# Patient Record
Sex: Female | Born: 1940 | Race: Black or African American | Hispanic: No | Marital: Single | Smoking: Never smoker
Health system: Southern US, Community
[De-identification: ages and names within clinical notes are randomized; demographics above are authoritative.]

## PROBLEM LIST (undated history)

## (undated) DIAGNOSIS — I10 Essential (primary) hypertension: Secondary | ICD-10-CM

## (undated) DIAGNOSIS — E785 Hyperlipidemia, unspecified: Secondary | ICD-10-CM

## (undated) DIAGNOSIS — F0390 Unspecified dementia without behavioral disturbance: Secondary | ICD-10-CM

## (undated) DIAGNOSIS — E079 Disorder of thyroid, unspecified: Secondary | ICD-10-CM

## (undated) DIAGNOSIS — N184 Chronic kidney disease, stage 4 (severe): Secondary | ICD-10-CM

## (undated) HISTORY — PX: PITUITARY SURGERY: SHX203

## (undated) HISTORY — PX: BREAST SURGERY: SHX581

---

## 2020-06-07 ENCOUNTER — Other Ambulatory Visit: Payer: Self-pay

## 2020-06-07 ENCOUNTER — Emergency Department (HOSPITAL_COMMUNITY): Payer: Medicare PPO

## 2020-06-07 ENCOUNTER — Emergency Department (HOSPITAL_COMMUNITY)
Admission: EM | Admit: 2020-06-07 | Discharge: 2020-06-07 | Disposition: A | Discharge status: Home / Self Care | Payer: Medicare PPO | Attending: Emergency Medicine | Admitting: Emergency Medicine

## 2020-06-07 DIAGNOSIS — G9389 Other specified disorders of brain: Secondary | ICD-10-CM

## 2020-06-07 DIAGNOSIS — R531 Weakness: Secondary | ICD-10-CM | POA: Diagnosis not present

## 2020-06-07 DIAGNOSIS — U099 Post covid-19 condition, unspecified: Secondary | ICD-10-CM | POA: Diagnosis not present

## 2020-06-07 DIAGNOSIS — U071 COVID-19: Secondary | ICD-10-CM

## 2020-06-07 LAB — URINALYSIS, ROUTINE W REFLEX MICROSCOPIC
Bacteria, UA: NONE SEEN
Bilirubin Urine: NEGATIVE
Glucose, UA: NEGATIVE mg/dL
Hgb urine dipstick: NEGATIVE
Ketones, ur: NEGATIVE mg/dL
Leukocytes,Ua: NEGATIVE
Nitrite: NEGATIVE
Protein, ur: 100 mg/dL — AB
Specific Gravity, Urine: 1.012 (ref 1.005–1.030)
pH: 6 (ref 5.0–8.0)

## 2020-06-07 LAB — CBC WITH DIFFERENTIAL/PLATELET
Abs Immature Granulocytes: 0.03 10*3/uL (ref 0.00–0.07)
Basophils Absolute: 0 10*3/uL (ref 0.0–0.1)
Basophils Relative: 0 %
Eosinophils Absolute: 0 10*3/uL (ref 0.0–0.5)
Eosinophils Relative: 0 %
HCT: 35.4 % — ABNORMAL LOW (ref 36.0–46.0)
Hemoglobin: 10.9 g/dL — ABNORMAL LOW (ref 12.0–15.0)
Immature Granulocytes: 0 %
Lymphocytes Relative: 8 %
Lymphs Abs: 0.6 10*3/uL — ABNORMAL LOW (ref 0.7–4.0)
MCH: 26.2 pg (ref 26.0–34.0)
MCHC: 30.8 g/dL (ref 30.0–36.0)
MCV: 85.1 fL (ref 80.0–100.0)
Monocytes Absolute: 0.4 10*3/uL (ref 0.1–1.0)
Monocytes Relative: 6 %
Neutro Abs: 6.1 10*3/uL (ref 1.7–7.7)
Neutrophils Relative %: 86 %
Platelets: 199 10*3/uL (ref 150–400)
RBC: 4.16 MIL/uL (ref 3.87–5.11)
RDW: 17.2 % — ABNORMAL HIGH (ref 11.5–15.5)
WBC: 7.2 10*3/uL (ref 4.0–10.5)
nRBC: 0 % (ref 0.0–0.2)

## 2020-06-07 LAB — TROPONIN I (HIGH SENSITIVITY)
Troponin I (High Sensitivity): 36 ng/L — ABNORMAL HIGH (ref <18)
Troponin I (High Sensitivity): 37 ng/L — ABNORMAL HIGH (ref <18)

## 2020-06-07 LAB — COMPREHENSIVE METABOLIC PANEL
ALT: 24 U/L (ref 0–44)
AST: 35 U/L (ref 15–41)
Albumin: 3 g/dL — ABNORMAL LOW (ref 3.5–5.0)
Alkaline Phosphatase: 50 U/L (ref 38–126)
Anion gap: 9 (ref 5–15)
BUN: 41 mg/dL — ABNORMAL HIGH (ref 8–23)
CO2: 24 mmol/L (ref 22–32)
Calcium: 10.5 mg/dL — ABNORMAL HIGH (ref 8.9–10.3)
Chloride: 107 mmol/L (ref 98–111)
Creatinine, Ser: 3.15 mg/dL — ABNORMAL HIGH (ref 0.44–1.00)
GFR, Estimated: 14 mL/min — ABNORMAL LOW (ref >60)
Glucose, Bld: 144 mg/dL — ABNORMAL HIGH (ref 70–99)
Potassium: 3.6 mmol/L (ref 3.5–5.1)
Sodium: 140 mmol/L (ref 135–145)
Total Bilirubin: 0.2 mg/dL — ABNORMAL LOW (ref 0.3–1.2)
Total Protein: 6.7 g/dL (ref 6.5–8.1)

## 2020-06-07 MED ORDER — SODIUM CHLORIDE 0.9 % IV BOLUS
500.0000 mL | Freq: Once | INTRAVENOUS | Status: AC
  Administered 2020-06-07: 500 mL via INTRAVENOUS

## 2020-06-07 MED ORDER — DEXAMETHASONE SODIUM PHOSPHATE 10 MG/ML IJ SOLN
10.0000 mg | Freq: Once | INTRAMUSCULAR | Status: AC
  Administered 2020-06-07: 10 mg via INTRAVENOUS
  Filled 2020-06-07: qty 1

## 2020-06-07 NOTE — Discharge Instructions (Addendum)
Follow-up with Dr. Kathyrn Sheriff for your pituitary problem.  Call them this week for an appointment in the next week or 2.  He also has been referred to the [facility] clinic and they will get in touch with you if treatment is necessary

## 2020-06-07 NOTE — ED Provider Notes (Signed)
[facility] DEPT Provider Note   CSN: [id number] Arrival date & time: 06/07/20  1427     History No chief complaint on file.   Allison Wang is a 80 y.o. female.  Patient with positive COVID on 17 January and has been weak ever since then.   No vomiting no diarrhea no dyspnea  The history is provided by a relative. No language interpreter was used.  Weakness Severity:  Moderate Onset quality:  Sudden Timing:  Constant Progression:  Unable to specify Chronicity:  New Context: not alcohol use   Relieved by:  Nothing Worsened by:  Nothing Ineffective treatments:  None tried Associated symptoms: no abdominal pain        No past medical history on file.  There are no problems to display for this patient.    OB History   No obstetric history on file.     No family history on file.     Home Medications Prior to Admission medications   Not on File    Allergies    Patient has no allergy information on record.  Review of Systems   Review of Systems  Unable to perform ROS: Dementia  Gastrointestinal: Negative for abdominal pain.  Neurological: Positive for weakness.    Physical Exam Updated Vital Signs BP 136/81   Pulse 65   Temp 98.2 F (36.8 C) (Oral)   Resp 18   SpO2 95%   Physical Exam Vitals and nursing note reviewed.  Constitutional:      Appearance: She is well-developed.  HENT:     Head: Normocephalic.     Nose: Nose normal.  Eyes:     General: No scleral icterus.    Extraocular Movements: EOM normal.     Conjunctiva/sclera: Conjunctivae normal.  Neck:     Thyroid: No thyromegaly.  Cardiovascular:     Rate and Rhythm: Normal rate and regular rhythm.     Heart sounds: No murmur heard. No friction rub. No gallop.   Pulmonary:     Breath sounds: No stridor. No wheezing or rales.  Chest:     Chest wall: No tenderness.  Abdominal:     General: There is no distension.     Tenderness: There is no  abdominal tenderness. There is no rebound.  Musculoskeletal:        General: No edema. Normal range of motion.     Cervical back: Neck supple.  Lymphadenopathy:     Cervical: No cervical adenopathy.  Skin:    Findings: No erythema or rash.  Neurological:     Mental Status: She is alert.     Motor: No abnormal muscle tone.     Coordination: Coordination normal.     Comments: Patient is awake but nonverbal this is her normal dementia  Psychiatric:        Mood and Affect: Mood and affect normal.     ED Results / Procedures / Treatments   Labs (all labs ordered are listed, but only abnormal results are displayed) Labs Reviewed  CBC WITH DIFFERENTIAL/PLATELET - Abnormal; Notable for the following components:      Result Value   Hemoglobin 10.9 (*)    HCT 35.4 (*)    RDW 17.2 (*)    Lymphs Abs 0.6 (*)    All other components within normal limits  COMPREHENSIVE METABOLIC PANEL - Abnormal; Notable for the following components:   Glucose, Bld 144 (*)    BUN 41 (*)    Creatinine,  Ser 3.15 (*)    Calcium 10.5 (*)    Albumin 3.0 (*)    Total Bilirubin 0.2 (*)    GFR, Estimated 14 (*)    All other components within normal limits  TROPONIN I (HIGH SENSITIVITY) - Abnormal; Notable for the following components:   Troponin I (High Sensitivity) 36 (*)    All other components within normal limits  TROPONIN I (HIGH SENSITIVITY) - Abnormal; Notable for the following components:   Troponin I (High Sensitivity) 37 (*)    All other components within normal limits  URINALYSIS, ROUTINE W REFLEX MICROSCOPIC    EKG EKG Interpretation  Date/Time:  Sunday June 07 2020 14:56:58 EST Ventricular Rate:  68 PR Interval:    QRS Duration: 150 QT Interval:  443 QTC Calculation: 472 R Axis:   -131 Text Interpretation: Sinus rhythm Atrial premature complex Nonspecific intraventricular conduction delay Confirmed by Milton Ferguson [phone]) on 06/07/2020 6:36:25 PM   Radiology CT Head Wo  Contrast  Result Date: 06/07/2020 CLINICAL DATA:  COVID-19 positive, lethargy EXAM: CT HEAD WITHOUT CONTRAST TECHNIQUE: Contiguous axial images were obtained from the base of the skull through the vertex without intravenous contrast. COMPARISON:  None. FINDINGS: Brain: No acute infarct or hemorrhage. Lateral ventricles are unremarkable. No acute extra-axial fluid collections. There is a heterogeneous soft tissue mass within the sella turcica, measuring 3.3 x 2.6 x 1.8 cm. 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 MRI is recommended for further evaluation. Vascular: Prominent atherosclerosis of the internal carotid arteries. Skull: Bony remodeling of the sella turcica as a result of the mass. No other calvarial abnormalities. Sinuses/Orbits: Bony remodeling of the sphenoid sinus related to the sellar mass described above. Remaining paranasal sinuses are clear. Other: None. IMPRESSION: 1. Heterogeneous soft tissue mass centered within the sella turcica, measuring 3.3 x 2.6 x 1.8 cm. MRI is recommended for further evaluation. 2. No acute infarct or hemorrhage. Electronically Signed   By: Randa Ngo M.D.   On: 06/07/2020 15:40   DG Chest Port 1 View  Result Date: 06/07/2020 CLINICAL DATA:  COVID 19 positive 4 days ago, cough, dementia EXAM: PORTABLE CHEST 1 VIEW COMPARISON:  None. FINDINGS: The heart size and mediastinal contours are within normal limits. Both lungs are clear. The visualized skeletal structures are unremarkable. IMPRESSION: No active disease. Electronically Signed   By: Randa Ngo M.D.   On: 06/07/2020 16:06    Procedures Procedures (including critical care time)  Medications Ordered in ED Medications  sodium chloride 0.9 % bolus 500 mL (0 mLs Intravenous Stopped 06/07/20 1959)  dexamethasone (DECADRON) injection 10 mg (10 mg Intravenous Given 06/07/20 1716)    ED Course  I have reviewed  the triage vital signs and the nursing notes.  Pertinent labs & imaging results that were available during my care of the patient were reviewed by me and considered in my medical decision making (see chart for details).   Patient with COVID positive and elevated troponin with atrial fibs.  I spoke with cardiology and they feel like this is her normal.  She also has an elevated creatinine but her family says she has history of renal disease.  CT scan shows possible tumor.  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

## 2020-06-07 NOTE — ED Triage Notes (Signed)
Patient BIBA from home, tested COVID + on Wednesday. AOx1 at baseline and history of dementia. Daughter reports patient has been lethargic over the past few days. Also states she has been weak and unable to walk today.   BP 164/69 P 66 RR 40 CBG 171  T 99.8

## 2020-06-19 ENCOUNTER — Other Ambulatory Visit: Payer: Self-pay

## 2020-06-19 ENCOUNTER — Encounter (HOSPITAL_COMMUNITY): Payer: Self-pay | Admitting: Emergency Medicine

## 2020-06-19 ENCOUNTER — Ambulatory Visit (HOSPITAL_COMMUNITY)
Admission: EM | Admit: 2020-06-19 | Discharge: 2020-06-19 | Disposition: A | Discharge status: Home / Self Care | Payer: Medicare PPO

## 2020-06-19 DIAGNOSIS — I10 Essential (primary) hypertension: Secondary | ICD-10-CM | POA: Diagnosis not present

## 2020-06-19 HISTORY — DX: Disorder of thyroid, unspecified: E07.9

## 2020-06-19 HISTORY — DX: Hyperlipidemia, unspecified: E78.5

## 2020-06-19 HISTORY — DX: Essential (primary) hypertension: I10

## 2020-06-19 HISTORY — DX: Unspecified dementia, unspecified severity, without behavioral disturbance, psychotic disturbance, mood disturbance, and anxiety: F03.90

## 2020-06-19 NOTE — Discharge Instructions (Signed)
Please follow up with your primary care provider for recheck of your blood pressure, particularly if it continues to remain elevated.  Continue to monitor your blood pressure as needed.  Return for any chest pain, headache, vision changes or dizziness.

## 2020-06-19 NOTE — ED Provider Notes (Signed)
[facility]    CSN: [id number] Arrival date & time: 06/19/20  1835      History   Chief Complaint Chief Complaint  Patient presents with  . Hypertension    HPI Allison Wang is a 80 y.o. female.   Allison Wang presents with concerns about her blood pressure. She got a hold of a "5 hour energy" drink this morning of her daughters, and drank it. While at elder care her blood pressure was checked and found to be 193/60, 192/76 and 210/84, following drinking it. She takes 5mg  of amlodipine daily, per her daughter, and did take it today. Denies any headache, shortness of breath , vision changes, new weakness, swelling or other complaints. She had been generally more weak related to recent covid-19 infection, but daughter states today she did have more energy.    ROS per HPI, negative if not otherwise mentioned.      Past Medical History:  Diagnosis Date  . Dementia ([place])   . Hyperlipemia   . Hypertension   . Thyroid disease     There are no problems to display for this patient.   Past Surgical History:  Procedure Laterality Date  . BREAST SURGERY    . PITUITARY SURGERY      OB History   No obstetric history on file.      Home Medications    Prior to Admission medications   Medication Sig Start Date End Date Taking? Authorizing Provider  allopurinol (ZYLOPRIM) 100 MG tablet Take 200 mg by mouth daily. 06/18/20  Yes [provider]  amLODipine (NORVASC) 5 MG tablet  06/16/20  Yes [provider]  atorvastatin (LIPITOR) 40 MG tablet  06/18/20  Yes [provider]  brimonidine (ALPHAGAN) 0.2 % ophthalmic solution SMARTSIG:In Eye(s) 06/04/20  Yes [provider]  donepezil (ARICEPT) 10 MG tablet Take 10 mg by mouth daily. 06/18/20  Yes [provider]  folic acid (FOLVITE) 1 MG tablet  06/18/20  Yes [provider]  furosemide (LASIX) 20 MG tablet Take by mouth. 06/18/20  Yes [provider]   hydrALAZINE (APRESOLINE) 50 MG tablet Take 50 mg by mouth 3 (three) times daily. 06/04/20  Yes [provider]  hydrocortisone (CORTEF) 5 MG tablet Take by mouth. 06/18/20  Yes [provider]  levothyroxine (SYNTHROID) 50 MCG tablet Take 50 mcg by mouth daily. 06/18/20  Yes [provider]  potassium chloride SA (KLOR-CON) 20 MEQ tablet Take 20 mEq by mouth daily. 06/04/20  Yes [provider]  repaglinide (PRANDIN) 0.5 MG tablet Take 0.5 mg by mouth 3 (three) times daily. 06/04/20  Yes [provider]    Family History History reviewed. No pertinent family history.  Social History Social History   Tobacco Use  . Smoking status: Never Smoker  . Smokeless tobacco: Never Used  Substance Use Topics  . Alcohol use: Not Currently     Allergies   Patient has no known allergies.   Review of Systems Review of Systems   Physical Exam Triage Vital Signs ED Triage Vitals  Enc Vitals Group     BP 06/19/20 1907 (!) 147/68     Pulse Rate 06/19/20 1907 85     Resp 06/19/20 1907 17     Temp 06/19/20 1907 98.4 F (36.9 C)     Temp Source 06/19/20 1907 Oral     SpO2 06/19/20 1907 97 %     Weight --      Height --  Head Circumference --      Peak Flow --      Pain Score 06/19/20 1859 0     Pain Loc --      Pain Edu? --      Excl. in [place]? --    No data found.  Updated Vital Signs BP (!) 147/68 (BP Location: Right Arm)   Pulse 85   Temp 98.4 F (36.9 C) (Oral)   Resp 17   SpO2 97%   Visual Acuity Right Eye Distance:   Left Eye Distance:   Bilateral Distance:    Right Eye Near:   Left Eye Near:    Bilateral Near:     Physical Exam Constitutional:      General: She is not in acute distress.    Appearance: She is well-developed.  Cardiovascular:     Rate and Rhythm: Normal rate.  Pulmonary:     Effort: Pulmonary effort is normal.  Skin:    General: Skin is warm and dry.  Neurological:     Mental Status: She is alert.  Mental status is at baseline.      UC Treatments / Results  Labs (all labs ordered are listed, but only abnormal results are displayed) Labs Reviewed - No data to display  EKG   Radiology No results found.  Procedures Procedures (including critical care time)  Medications Ordered in UC Medications - No data to display  Initial Impression / Assessment and Plan / UC Course  I have reviewed the triage vital signs and the nursing notes.  Pertinent labs & imaging results that were available during my care of the patient were reviewed by me and considered in my medical decision making (see chart for details).     bp looks well tonight, much improved from previous blood pressures this morning, s/p drinking "5 hour energy."  Daughter verbalizes understanding that the caffeine likely contributed to her blood pressure elevation and to avoid these in the future. Return precautions provided. Daughter states they check her bp daily, to follow up with PCP for recheck as well. Patient's daughter verbalized understanding and agreeable to plan.   Final Clinical Impressions(s) / UC Diagnoses   Final diagnoses:  Hypertension, unspecified type     Discharge Instructions     Please follow up with your primary care provider for recheck of your blood pressure, particularly if it continues to remain elevated.  Continue to monitor your blood pressure as needed.  Return for any chest pain, headache, vision changes or dizziness.    ED Prescriptions    None     PDMP not reviewed this encounter.   Zigmund Gottron, NP 06/19/20 1929

## 2020-06-19 NOTE — ED Triage Notes (Signed)
Pt presents with HTN. Daughter states pt took 5 hour energy shot around 10am today. BP was taken at elder daycare with reading of  193/60 192/76 210/84

## 2020-11-05 ENCOUNTER — Other Ambulatory Visit (HOSPITAL_COMMUNITY): Payer: Self-pay | Admitting: Family Medicine

## 2020-11-05 DIAGNOSIS — R011 Cardiac murmur, unspecified: Secondary | ICD-10-CM

## 2020-11-06 ENCOUNTER — Other Ambulatory Visit: Payer: Self-pay | Admitting: Family Medicine

## 2020-11-06 DIAGNOSIS — Z780 Asymptomatic menopausal state: Secondary | ICD-10-CM

## 2020-11-12 ENCOUNTER — Other Ambulatory Visit (HOSPITAL_COMMUNITY): Payer: Self-pay | Admitting: Neurosurgery

## 2020-11-12 DIAGNOSIS — D497 Neoplasm of unspecified behavior of endocrine glands and other parts of nervous system: Secondary | ICD-10-CM

## 2020-11-24 ENCOUNTER — Other Ambulatory Visit: Payer: Self-pay

## 2020-11-24 ENCOUNTER — Ambulatory Visit (HOSPITAL_COMMUNITY)
Admission: RE | Admit: 2020-11-24 | Discharge: 2020-11-24 | Disposition: A | Discharge status: Home / Self Care | Payer: Self-pay | Source: Ambulatory Visit | Attending: Family Medicine | Admitting: Family Medicine

## 2020-11-24 DIAGNOSIS — R011 Cardiac murmur, unspecified: Secondary | ICD-10-CM | POA: Insufficient documentation

## 2020-11-24 DIAGNOSIS — I080 Rheumatic disorders of both mitral and aortic valves: Secondary | ICD-10-CM | POA: Insufficient documentation

## 2020-11-24 DIAGNOSIS — I10 Essential (primary) hypertension: Secondary | ICD-10-CM | POA: Insufficient documentation

## 2020-11-24 DIAGNOSIS — I313 Pericardial effusion (noninflammatory): Secondary | ICD-10-CM | POA: Insufficient documentation

## 2020-11-24 DIAGNOSIS — E785 Hyperlipidemia, unspecified: Secondary | ICD-10-CM | POA: Insufficient documentation

## 2020-11-24 LAB — ECHOCARDIOGRAM COMPLETE
AR max vel: 2.6 cm2
AV Area VTI: 2.8 cm2
AV Area mean vel: 2.64 cm2
AV Mean grad: 8.8 mmHg
AV Peak grad: 17 mmHg
Ao pk vel: 2.06 m/s
Area-P 1/2: 3.12 cm2
MV M vel: 4.97 m/s
MV Peak grad: 98.8 mmHg
S' Lateral: 2.5 cm

## 2020-11-26 ENCOUNTER — Encounter (HOSPITAL_COMMUNITY): Payer: Self-pay

## 2020-11-26 ENCOUNTER — Ambulatory Visit (HOSPITAL_COMMUNITY)
Admission: RE | Admit: 2020-11-26 | Discharge: 2020-11-26 | Disposition: A | Discharge status: Home / Self Care | Payer: Medicare PPO | Source: Ambulatory Visit | Attending: Neurosurgery | Admitting: Neurosurgery

## 2020-11-26 ENCOUNTER — Other Ambulatory Visit: Payer: Self-pay

## 2020-11-26 DIAGNOSIS — D497 Neoplasm of unspecified behavior of endocrine glands and other parts of nervous system: Secondary | ICD-10-CM

## 2020-11-26 NOTE — Progress Notes (Signed)
Pt could not tolerate MRI. Could not remain still during scanning which resulted in motion degradation of imaging and pt pulled out IV that had to be obrtained throught IV team consult. Family aware and will f/u with ordering provider on a possible solution.

## 2020-11-27 ENCOUNTER — Other Ambulatory Visit: Payer: Self-pay | Admitting: Internal Medicine

## 2020-11-27 DIAGNOSIS — N184 Chronic kidney disease, stage 4 (severe): Secondary | ICD-10-CM

## 2020-12-01 ENCOUNTER — Ambulatory Visit
Admission: RE | Admit: 2020-12-01 | Discharge: 2020-12-01 | Disposition: A | Discharge status: Home / Self Care | Payer: Medicare PPO | Source: Ambulatory Visit | Attending: Internal Medicine | Admitting: Internal Medicine

## 2020-12-01 DIAGNOSIS — N184 Chronic kidney disease, stage 4 (severe): Secondary | ICD-10-CM

## 2020-12-21 ENCOUNTER — Emergency Department (HOSPITAL_COMMUNITY): Payer: Medicare PPO

## 2020-12-21 ENCOUNTER — Other Ambulatory Visit: Payer: Self-pay

## 2020-12-21 ENCOUNTER — Inpatient Hospital Stay (HOSPITAL_COMMUNITY): Payer: Medicare PPO

## 2020-12-21 ENCOUNTER — Inpatient Hospital Stay (HOSPITAL_COMMUNITY)
Admission: EM | Admit: 2020-12-21 | Discharge: 2021-01-14 | DRG: 193 | Disposition: E | Payer: Medicare PPO | Attending: Family Medicine | Admitting: Family Medicine

## 2020-12-21 ENCOUNTER — Encounter (HOSPITAL_COMMUNITY): Payer: Self-pay

## 2020-12-21 DIAGNOSIS — J189 Pneumonia, unspecified organism: Secondary | ICD-10-CM | POA: Diagnosis present

## 2020-12-21 DIAGNOSIS — Z8616 Personal history of COVID-19: Secondary | ICD-10-CM | POA: Diagnosis not present

## 2020-12-21 DIAGNOSIS — G9341 Metabolic encephalopathy: Secondary | ICD-10-CM | POA: Diagnosis present

## 2020-12-21 DIAGNOSIS — R159 Full incontinence of feces: Secondary | ICD-10-CM | POA: Diagnosis present

## 2020-12-21 DIAGNOSIS — D497 Neoplasm of unspecified behavior of endocrine glands and other parts of nervous system: Secondary | ICD-10-CM | POA: Diagnosis present

## 2020-12-21 DIAGNOSIS — I132 Hypertensive heart and chronic kidney disease with heart failure and with stage 5 chronic kidney disease, or end stage renal disease: Secondary | ICD-10-CM | POA: Diagnosis present

## 2020-12-21 DIAGNOSIS — E039 Hypothyroidism, unspecified: Secondary | ICD-10-CM | POA: Diagnosis present

## 2020-12-21 DIAGNOSIS — E119 Type 2 diabetes mellitus without complications: Secondary | ICD-10-CM

## 2020-12-21 DIAGNOSIS — Z888 Allergy status to other drugs, medicaments and biological substances status: Secondary | ICD-10-CM

## 2020-12-21 DIAGNOSIS — R001 Bradycardia, unspecified: Secondary | ICD-10-CM | POA: Diagnosis not present

## 2020-12-21 DIAGNOSIS — R0602 Shortness of breath: Secondary | ICD-10-CM

## 2020-12-21 DIAGNOSIS — E1122 Type 2 diabetes mellitus with diabetic chronic kidney disease: Secondary | ICD-10-CM | POA: Diagnosis present

## 2020-12-21 DIAGNOSIS — E230 Hypopituitarism: Secondary | ICD-10-CM | POA: Diagnosis present

## 2020-12-21 DIAGNOSIS — Z7189 Other specified counseling: Secondary | ICD-10-CM | POA: Diagnosis not present

## 2020-12-21 DIAGNOSIS — N179 Acute kidney failure, unspecified: Secondary | ICD-10-CM | POA: Diagnosis present

## 2020-12-21 DIAGNOSIS — F0390 Unspecified dementia without behavioral disturbance: Secondary | ICD-10-CM | POA: Diagnosis present

## 2020-12-21 DIAGNOSIS — Z66 Do not resuscitate: Secondary | ICD-10-CM | POA: Diagnosis not present

## 2020-12-21 DIAGNOSIS — M109 Gout, unspecified: Secondary | ICD-10-CM | POA: Diagnosis present

## 2020-12-21 DIAGNOSIS — Z20822 Contact with and (suspected) exposure to covid-19: Secondary | ICD-10-CM | POA: Diagnosis present

## 2020-12-21 DIAGNOSIS — R627 Adult failure to thrive: Secondary | ICD-10-CM | POA: Diagnosis present

## 2020-12-21 DIAGNOSIS — R7989 Other specified abnormal findings of blood chemistry: Secondary | ICD-10-CM | POA: Diagnosis not present

## 2020-12-21 DIAGNOSIS — Z515 Encounter for palliative care: Secondary | ICD-10-CM

## 2020-12-21 DIAGNOSIS — R0902 Hypoxemia: Secondary | ICD-10-CM | POA: Diagnosis not present

## 2020-12-21 DIAGNOSIS — R7401 Elevation of levels of liver transaminase levels: Secondary | ICD-10-CM | POA: Diagnosis present

## 2020-12-21 DIAGNOSIS — Z79890 Hormone replacement therapy (postmenopausal): Secondary | ICD-10-CM

## 2020-12-21 DIAGNOSIS — E860 Dehydration: Secondary | ICD-10-CM | POA: Diagnosis present

## 2020-12-21 DIAGNOSIS — E785 Hyperlipidemia, unspecified: Secondary | ICD-10-CM | POA: Diagnosis present

## 2020-12-21 DIAGNOSIS — D649 Anemia, unspecified: Secondary | ICD-10-CM

## 2020-12-21 DIAGNOSIS — Z6826 Body mass index (BMI) 26.0-26.9, adult: Secondary | ICD-10-CM

## 2020-12-21 DIAGNOSIS — J9601 Acute respiratory failure with hypoxia: Secondary | ICD-10-CM | POA: Diagnosis present

## 2020-12-21 DIAGNOSIS — Z833 Family history of diabetes mellitus: Secondary | ICD-10-CM

## 2020-12-21 DIAGNOSIS — Z7984 Long term (current) use of oral hypoglycemic drugs: Secondary | ICD-10-CM

## 2020-12-21 DIAGNOSIS — E875 Hyperkalemia: Secondary | ICD-10-CM | POA: Diagnosis present

## 2020-12-21 DIAGNOSIS — D631 Anemia in chronic kidney disease: Secondary | ICD-10-CM | POA: Diagnosis present

## 2020-12-21 DIAGNOSIS — N189 Chronic kidney disease, unspecified: Secondary | ICD-10-CM | POA: Diagnosis present

## 2020-12-21 DIAGNOSIS — I5032 Chronic diastolic (congestive) heart failure: Secondary | ICD-10-CM | POA: Diagnosis present

## 2020-12-21 DIAGNOSIS — R4182 Altered mental status, unspecified: Secondary | ICD-10-CM | POA: Diagnosis not present

## 2020-12-21 DIAGNOSIS — Z7401 Bed confinement status: Secondary | ICD-10-CM

## 2020-12-21 DIAGNOSIS — R1310 Dysphagia, unspecified: Secondary | ICD-10-CM | POA: Diagnosis present

## 2020-12-21 DIAGNOSIS — R32 Unspecified urinary incontinence: Secondary | ICD-10-CM | POA: Diagnosis present

## 2020-12-21 DIAGNOSIS — I10 Essential (primary) hypertension: Secondary | ICD-10-CM

## 2020-12-21 DIAGNOSIS — N185 Chronic kidney disease, stage 5: Secondary | ICD-10-CM | POA: Diagnosis present

## 2020-12-21 DIAGNOSIS — Z8249 Family history of ischemic heart disease and other diseases of the circulatory system: Secondary | ICD-10-CM

## 2020-12-21 DIAGNOSIS — Z79899 Other long term (current) drug therapy: Secondary | ICD-10-CM

## 2020-12-21 DIAGNOSIS — N170 Acute kidney failure with tubular necrosis: Secondary | ICD-10-CM | POA: Diagnosis not present

## 2020-12-21 HISTORY — DX: Chronic kidney disease, stage 4 (severe): N18.4

## 2020-12-21 LAB — CBC WITH DIFFERENTIAL/PLATELET
Abs Immature Granulocytes: 0.04 10*3/uL (ref 0.00–0.07)
Basophils Absolute: 0 10*3/uL (ref 0.0–0.1)
Basophils Relative: 0 %
Eosinophils Absolute: 0 10*3/uL (ref 0.0–0.5)
Eosinophils Relative: 0 %
HCT: 25.6 % — ABNORMAL LOW (ref 36.0–46.0)
Hemoglobin: 7.4 g/dL — ABNORMAL LOW (ref 12.0–15.0)
Immature Granulocytes: 0 %
Lymphocytes Relative: 7 %
Lymphs Abs: 0.7 10*3/uL (ref 0.7–4.0)
MCH: 25.3 pg — ABNORMAL LOW (ref 26.0–34.0)
MCHC: 28.9 g/dL — ABNORMAL LOW (ref 30.0–36.0)
MCV: 87.4 fL (ref 80.0–100.0)
Monocytes Absolute: 0.3 10*3/uL (ref 0.1–1.0)
Monocytes Relative: 4 %
Neutro Abs: 8.2 10*3/uL — ABNORMAL HIGH (ref 1.7–7.7)
Neutrophils Relative %: 89 %
Platelets: 308 10*3/uL (ref 150–400)
RBC: 2.93 MIL/uL — ABNORMAL LOW (ref 3.87–5.11)
RDW: 19.9 % — ABNORMAL HIGH (ref 11.5–15.5)
WBC: 9.2 10*3/uL (ref 4.0–10.5)
nRBC: 0.9 % — ABNORMAL HIGH (ref 0.0–0.2)

## 2020-12-21 LAB — RAPID URINE DRUG SCREEN, HOSP PERFORMED
Amphetamines: NOT DETECTED
Barbiturates: NOT DETECTED
Benzodiazepines: NOT DETECTED
Cocaine: NOT DETECTED
Opiates: NOT DETECTED
Tetrahydrocannabinol: NOT DETECTED

## 2020-12-21 LAB — COMPREHENSIVE METABOLIC PANEL
ALT: 263 U/L — ABNORMAL HIGH (ref 0–44)
ALT: 234 U/L — ABNORMAL HIGH (ref 0–44)
AST: 471 U/L — ABNORMAL HIGH (ref 15–41)
AST: 327 U/L — ABNORMAL HIGH (ref 15–41)
Albumin: 3.1 g/dL — ABNORMAL LOW (ref 3.5–5.0)
Albumin: 2.9 g/dL — ABNORMAL LOW (ref 3.5–5.0)
Alkaline Phosphatase: 130 U/L — ABNORMAL HIGH (ref 38–126)
Alkaline Phosphatase: 115 U/L (ref 38–126)
Anion gap: 12 (ref 5–15)
Anion gap: 10 (ref 5–15)
BUN: 62 mg/dL — ABNORMAL HIGH (ref 8–23)
BUN: 63 mg/dL — ABNORMAL HIGH (ref 8–23)
CO2: 23 mmol/L (ref 22–32)
CO2: 24 mmol/L (ref 22–32)
Calcium: 9.9 mg/dL (ref 8.9–10.3)
Calcium: 9.8 mg/dL (ref 8.9–10.3)
Chloride: 106 mmol/L (ref 98–111)
Chloride: 108 mmol/L (ref 98–111)
Creatinine, Ser: 4.13 mg/dL — ABNORMAL HIGH (ref 0.44–1.00)
Creatinine, Ser: 4.16 mg/dL — ABNORMAL HIGH (ref 0.44–1.00)
GFR, Estimated: 10 mL/min — ABNORMAL LOW (ref >60)
GFR, Estimated: 10 mL/min — ABNORMAL LOW (ref >60)
Glucose, Bld: 180 mg/dL — ABNORMAL HIGH (ref 70–99)
Glucose, Bld: 131 mg/dL — ABNORMAL HIGH (ref 70–99)
Potassium: 6.1 mmol/L — ABNORMAL HIGH (ref 3.5–5.1)
Potassium: 4.1 mmol/L (ref 3.5–5.1)
Sodium: 141 mmol/L (ref 135–145)
Sodium: 142 mmol/L (ref 135–145)
Total Bilirubin: 1.1 mg/dL (ref 0.3–1.2)
Total Bilirubin: 0.4 mg/dL (ref 0.3–1.2)
Total Protein: 5.5 g/dL — ABNORMAL LOW (ref 6.5–8.1)
Total Protein: 6 g/dL — ABNORMAL LOW (ref 6.5–8.1)

## 2020-12-21 LAB — RESP PANEL BY RT-PCR (FLU A&B, COVID) ARPGX2
Influenza A by PCR: NEGATIVE
Influenza B by PCR: NEGATIVE
SARS Coronavirus 2 by RT PCR: NEGATIVE

## 2020-12-21 LAB — ABO/RH: ABO/RH(D): B POS

## 2020-12-21 LAB — SODIUM, URINE, RANDOM: Sodium, Ur: <10 mmol/L

## 2020-12-21 LAB — I-STAT ARTERIAL BLOOD GAS, ED
Acid-Base Excess: 1 mmol/L (ref 0.0–2.0)
Bicarbonate: 26.6 mmol/L (ref 20.0–28.0)
Calcium, Ion: 1.38 mmol/L (ref 1.15–1.40)
HCT: 26 % — ABNORMAL LOW (ref 36.0–46.0)
Hemoglobin: 8.8 g/dL — ABNORMAL LOW (ref 12.0–15.0)
O2 Saturation: 83 %
Patient temperature: 98.6
Potassium: 4.2 mmol/L (ref 3.5–5.1)
Sodium: 145 mmol/L (ref 135–145)
TCO2: 28 mmol/L (ref 22–32)
pCO2 arterial: 45.7 mmHg (ref 32.0–48.0)
pH, Arterial: 7.373 (ref 7.350–7.450)
pO2, Arterial: 50 mmHg — ABNORMAL LOW (ref 83.0–108.0)

## 2020-12-21 LAB — PHOSPHORUS: Phosphorus: 4.7 mg/dL — ABNORMAL HIGH (ref 2.5–4.6)

## 2020-12-21 LAB — URINALYSIS, ROUTINE W REFLEX MICROSCOPIC
Bacteria, UA: NONE SEEN
Bilirubin Urine: NEGATIVE
Glucose, UA: NEGATIVE mg/dL
Hgb urine dipstick: NEGATIVE
Ketones, ur: NEGATIVE mg/dL
Leukocytes,Ua: NEGATIVE
Nitrite: NEGATIVE
Protein, ur: 30 mg/dL — AB
Specific Gravity, Urine: 1.016 (ref 1.005–1.030)
pH: 5 (ref 5.0–8.0)

## 2020-12-21 LAB — PROTIME-INR
INR: 1 (ref 0.8–1.2)
Prothrombin Time: 13.3 seconds (ref 11.4–15.2)

## 2020-12-21 LAB — HEMOGLOBIN A1C
Hgb A1c MFr Bld: 5.8 % — ABNORMAL HIGH (ref 4.8–5.6)
Mean Plasma Glucose: 119.76 mg/dL

## 2020-12-21 LAB — POC OCCULT BLOOD, ED: Fecal Occult Bld: POSITIVE — AB

## 2020-12-21 LAB — AMMONIA: Ammonia: 22 umol/L (ref 9–35)

## 2020-12-21 LAB — BRAIN NATRIURETIC PEPTIDE: B Natriuretic Peptide: 913.4 pg/mL — ABNORMAL HIGH (ref 0.0–100.0)

## 2020-12-21 LAB — CBG MONITORING, ED
Glucose-Capillary: 126 mg/dL — ABNORMAL HIGH (ref 70–99)
Glucose-Capillary: 173 mg/dL — ABNORMAL HIGH (ref 70–99)
Glucose-Capillary: 99 mg/dL (ref 70–99)

## 2020-12-21 LAB — LACTIC ACID, PLASMA
Lactic Acid, Venous: 2 mmol/L (ref 0.5–1.9)
Lactic Acid, Venous: 2.5 mmol/L (ref 0.5–1.9)
Lactic Acid, Venous: 1.7 mmol/L (ref 0.5–1.9)

## 2020-12-21 LAB — PROCALCITONIN: Procalcitonin: 1.07 ng/mL

## 2020-12-21 LAB — STREP PNEUMONIAE URINARY ANTIGEN: Strep Pneumo Urinary Antigen: NEGATIVE

## 2020-12-21 LAB — MRSA NEXT GEN BY PCR, NASAL: MRSA by PCR Next Gen: NOT DETECTED

## 2020-12-21 LAB — CREATININE, URINE, RANDOM: Creatinine, Urine: 165.86 mg/dL

## 2020-12-21 LAB — TROPONIN I (HIGH SENSITIVITY): Troponin I (High Sensitivity): 98 ng/L — ABNORMAL HIGH (ref <18)

## 2020-12-21 LAB — CK: Total CK: 62 U/L (ref 38–234)

## 2020-12-21 LAB — MAGNESIUM: Magnesium: 2.4 mg/dL (ref 1.7–2.4)

## 2020-12-21 LAB — TSH: TSH: 1.411 u[IU]/mL (ref 0.350–4.500)

## 2020-12-21 MED ORDER — SODIUM CHLORIDE 0.9 % IV SOLN
2.0000 g | INTRAVENOUS | Status: DC
  Administered 2020-12-22 – 2020-12-23 (×2): 2 g via INTRAVENOUS
  Filled 2020-12-21 (×2): qty 20

## 2020-12-21 MED ORDER — SODIUM CHLORIDE 0.9 % IV SOLN
1.0000 g | Freq: Once | INTRAVENOUS | Status: AC
  Administered 2020-12-21: 1 g via INTRAVENOUS
  Filled 2020-12-21: qty 10

## 2020-12-21 MED ORDER — SODIUM CHLORIDE 0.9% IV SOLUTION: Freq: Once | INTRAVENOUS | Status: DC

## 2020-12-21 MED ORDER — POLYETHYLENE GLYCOL 3350 17 G PO PACK: 17.0000 g | PACK | Freq: Every day | ORAL | Status: DC | PRN

## 2020-12-21 MED ORDER — ALBUTEROL SULFATE (2.5 MG/3ML) 0.083% IN NEBU: 2.5000 mg | INHALATION_SOLUTION | RESPIRATORY_TRACT | Status: DC | PRN

## 2020-12-21 MED ORDER — SODIUM CHLORIDE 0.9 % IV SOLN
75.0000 mL/h | INTRAVENOUS | Status: AC
  Administered 2020-12-21: 75 mL/h via INTRAVENOUS

## 2020-12-21 MED ORDER — LATANOPROST 0.005 % OP SOLN
1.0000 [drp] | Freq: Every day | OPHTHALMIC | Status: DC
  Administered 2020-12-21 – 2020-12-24 (×3): 1 [drp] via OPHTHALMIC
  Filled 2020-12-21 (×2): qty 2.5

## 2020-12-21 MED ORDER — SODIUM CHLORIDE 0.9 % IV SOLN
500.0000 mg | INTRAVENOUS | Status: DC
  Administered 2020-12-21 – 2020-12-24 (×4): 500 mg via INTRAVENOUS
  Filled 2020-12-21 (×5): qty 500

## 2020-12-21 MED ORDER — LEVOTHYROXINE SODIUM 50 MCG PO TABS
50.0000 ug | ORAL_TABLET | Freq: Every day | ORAL | Status: DC
  Administered 2020-12-23: 50 ug via ORAL
  Filled 2020-12-21 (×2): qty 1

## 2020-12-21 MED ORDER — DEXTROSE 50 % IV SOLN
1.0000 | Freq: Once | INTRAVENOUS | Status: DC
  Filled 2020-12-21: qty 50

## 2020-12-21 MED ORDER — INSULIN ASPART 100 UNIT/ML IV SOLN: 5.0000 [IU] | Freq: Once | INTRAVENOUS | Status: DC

## 2020-12-21 MED ORDER — BRIMONIDINE TARTRATE 0.2 % OP SOLN
1.0000 [drp] | Freq: Every morning | OPHTHALMIC | Status: DC
  Administered 2020-12-24: 1 [drp] via OPHTHALMIC
  Filled 2020-12-21: qty 5

## 2020-12-21 MED ORDER — ACETAMINOPHEN 650 MG RE SUPP: 650.0000 mg | Freq: Four times a day (QID) | RECTAL | Status: DC | PRN

## 2020-12-21 MED ORDER — ACETAMINOPHEN 325 MG PO TABS: 650.0000 mg | ORAL_TABLET | Freq: Four times a day (QID) | ORAL | Status: DC | PRN

## 2020-12-21 MED ORDER — HYDROCORTISONE NA SUCCINATE PF 100 MG IJ SOLR
100.0000 mg | Freq: Three times a day (TID) | INTRAMUSCULAR | Status: DC
  Administered 2020-12-21 – 2020-12-25 (×11): 100 mg via INTRAVENOUS
  Filled 2020-12-21 (×11): qty 2

## 2020-12-21 MED ORDER — INSULIN ASPART 100 UNIT/ML IJ SOLN
0.0000 [IU] | INTRAMUSCULAR | Status: DC
  Administered 2020-12-22 – 2020-12-23 (×3): 1 [IU] via SUBCUTANEOUS
  Administered 2020-12-24: 3 [IU] via SUBCUTANEOUS
  Administered 2020-12-24: 2 [IU] via SUBCUTANEOUS
  Administered 2020-12-24: 5 [IU] via SUBCUTANEOUS
  Administered 2020-12-24 – 2020-12-25 (×2): 2 [IU] via SUBCUTANEOUS

## 2020-12-21 NOTE — ED Notes (Signed)
US at bedside

## 2020-12-21 NOTE — ED Notes (Signed)
Nurse attempting to place IV US/obtain blood.

## 2020-12-21 NOTE — ED Notes (Signed)
Female purewick placed.

## 2020-12-21 NOTE — ED Notes (Signed)
I was finally able to obtain blood from pt. I asked ED resident if he wants to me to send down another CMP to recheck labs to see if they were correct due to the CBC being hemolyzed with the first one. He said to recheck the values first before giving the insulin for the potassium.

## 2020-12-21 NOTE — ED Notes (Signed)
Dr. Roel Cluck aware I'm waiting for repeat CMP to come back before giving insulin especially since K on ABG was normal.

## 2020-12-21 NOTE — ED Triage Notes (Signed)
Pt from home. LKN at 0200. Daughter went over today and pt has been laying in bed all day. Usually pt has been able to walk with a walker and feeds herself, but hasn't been able to today. No unilateral weakness. EMS says pt hasn't spoke to them at all.

## 2020-12-21 NOTE — ED Notes (Signed)
I attempted to place IV twice without success. IV team consulted. See new order.

## 2020-12-21 NOTE — ED Notes (Signed)
I straight cath'd pt for urine since brief was still dry. Occult completed and taken to lab. Pt pulled up in bed and readjusted. New brief placed on pt (had a small amount of stool in it) and purewick placed again.

## 2020-12-21 NOTE — ED Notes (Signed)
Patient transported to CT 

## 2020-12-21 NOTE — ED Notes (Signed)
ED resident attempting to place IV [place].

## 2020-12-21 NOTE — Progress Notes (Signed)
Increased oxygen to 8lpm high flow cannula

## 2020-12-21 NOTE — ED Notes (Signed)
I attempted to obtain blood 4 times without success. I tried pulling from [place] lines, but they still don't pull back. Dr. Roel Cluck made aware.

## 2020-12-21 NOTE — ED Notes (Signed)
X-ray at bedside

## 2020-12-21 NOTE — ED Notes (Signed)
Pt is here slightly leaning to L side (new). Pt usually disoriented x4, able to walk with a walker short distances and able to feed herself. Pt unable to do it at all today. Pt nonverbal with me, daughter says she doesn't say a lot at home, but usually says more than that. Pt able to follow simple commands. Pt's pupils pinpoint. Daughter says that's normal for her and dr. Gilford Raid aware. Pt's HR dropping into the mid-30s. Dr. Gilford Raid aware. She says as long as pt's BP is maintaining, we'll just watch it.

## 2020-12-21 NOTE — Progress Notes (Signed)
ABG drawn while patient was on 3lpm

## 2020-12-21 NOTE — H&P (Signed)
Allison Wang:[id number] DOB: [birth year] DOA: 12/15/2020     PCP: Allison Nova, MD   Outpatient Specialists:    Nephrology:  Dr. Vianne Wang Neurology supposed to start follow up   Patient arrived to ER on 12/23/2020 at 1525 Referred by Attending Allison Pence, MD Patient coming from: home Lives  With family  Chief Complaint:   Chief Complaint  Patient presents with   Altered Mental Status    HPI: Allison Wang is a 80 y.o. female with medical history significant of DM2, dementia, CKD, thyroid disease, HTN, HLD covid infection Jan 2022, gout, chronic diastolic CHF  Pituitary disease  Presented with  confusion Family deneis any fever or cough At baseline ambulates has significant dementia and does not always recognize family Has known pituitary disease was supposed to have MIR for this but could not tolerate due to dementia plan was for MRI under anesthsia Does not smoke or drinlk  Recently moved here from [place]  Has  been vaccinated against COVID and boosted   Initial COVID TEST  NEGATIVE   Lab Results  Component Value Date   [place] NEGATIVE 12/31/2020     Regarding pertinent Chronic problems:     Hyperlipidemia - on statins Lipitor Lipid Panel  No results found for: CHOL, TRIG, HDL, CHOLHDL, VLDL, LDLCALC, LDLDIRECT, LABVLDL   HTN on NOrvasc Lasix, hydralazine  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  Lab Results  Component Value Date   CREATININE 4.13 (H) 12/20/2020   CREATININE 3.15 (H) 06/07/2020       Dementia - on Aricept   Chronic anemia  - baseline hg Hemoglobin & Hematocrit  Recent Labs    06/07/20 1713  HGB 10.9*   While in ER: CXR for CAP Started on Antibiotics Noted to have worsening renal function elevated K Elevated LFT    ED Triage Vitals  Enc Vitals Group     BP 01/10/2021 1553 (!) 129/96     Pulse Rate 12/20/2020 1553 (!) 47     Resp 01/06/2021 1553 16     Temp 01/01/2021 1553 97.6 F (36.4 C)     Temp Source 12/18/2020 1553 Oral     SpO2 01/01/2021 1543 95 %     Weight 12/29/2020 1555 152 lb (68.9 kg)     Height 12/27/2020 1555 5\' 3"  (1.6 m)     Head Circumference --      Peak Flow --      Pain Score --      Pain Loc --      Pain Edu? --      Excl. in [place]? --   TMAX(24)@     _________________________________________ Significant initial  Findings: Abnormal Labs Reviewed  COMPREHENSIVE METABOLIC PANEL - Abnormal; Notable for the following components:      Result Value   Potassium 6.1 (*)    Glucose, Bld 180 (*)    BUN 62 (*)    Creatinine, Ser 4.13 (*)    Total Protein 5.5 (*)    Albumin 3.1 (*)  AST 471 (*)    ALT 263 (*)    Alkaline Phosphatase 130 (*)    GFR, Estimated 10 (*)    All other components within normal limits  CBG MONITORING, ED - Abnormal; Notable for the following components:   Glucose-Capillary 173 (*)    All other components within normal limits  CBG MONITORING, ED - Abnormal; Notable for the following components:   Glucose-Capillary 126 (*)    All other components within normal limits   ____________________________________________ Ordered CT HEAD   NON acute  CXR - cardiomegaly, left side consolidation     RUQ [place] -  NON acute showing renal disease _________________________ Troponin  ordered ECG: Ordered Personally reviewed by me showing: HR :38 Rhythm: Sinus bradycardia     nonspecific changes,   QTC 423 ____________________ This patient meets SIRS Criteria and may be septic.    WBC     Component Value Date/Time   WBC 7.2 06/07/2020 1713   LYMPHSABS 0.6 (L)  06/07/2020 1713   MONOABS 0.4 06/07/2020 1713   EOSABS 0.0 06/07/2020 1713   BASOSABS 0.0 06/07/2020 1713    Lactic Acid, Venous    Component Value Date/Time   LATICACIDVEN 2.5 (HH) 01/09/2021 1730     Procalcitonin   Ordered      UA ordered   Urine analysis:    Component Value Date/Time   COLORURINE YELLOW 06/07/2020 1504   APPEARANCEUR CLEAR 06/07/2020 1504   LABSPEC 1.012 06/07/2020 1504   PHURINE 6.0 06/07/2020 1504   GLUCOSEU NEGATIVE 06/07/2020 1504   HGBUR NEGATIVE 06/07/2020 1504   BILIRUBINUR NEGATIVE 06/07/2020 1504   KETONESUR NEGATIVE 06/07/2020 1504   PROTEINUR 100 (A) 06/07/2020 1504   NITRITE NEGATIVE 06/07/2020 1504   LEUKOCYTESUR NEGATIVE 06/07/2020 1504    Results for orders placed or performed during the hospital encounter of 12/20/2020  Resp Panel by RT-PCR (Flu A&B, Covid) Nasopharyngeal Swab     Status: None   Collection Time: 01/04/2021  4:15 PM   Specimen: Nasopharyngeal Swab; Nasopharyngeal(NP) swabs in vial transport medium  Result Value Ref Range Status   SARS Coronavirus 2 by RT PCR NEGATIVE NEGATIVE Final         Influenza A by PCR NEGATIVE NEGATIVE Final   Influenza B by PCR NEGATIVE NEGATIVE Final           _______________________________________________________ ER Provider Called:  Cardiology   Dr.Ye They Recommend admit to medicine    Consult if vitals unstable _______________________________________________ Hospitalist was called for admission for bradycardia and CAP  The following Work up has been ordered so far:  Orders Placed This Encounter  Procedures   Resp Panel by RT-PCR (Flu A&B, Covid) Nasopharyngeal Swab   Culture, blood (routine x 2)   CT HEAD WO CONTRAST (5MM)   DG Chest Portable 1 View   CBC with Differential   Comprehensive metabolic panel   Urinalysis, Routine w reflex microscopic   TSH   Rapid urine drug screen (hospital performed)   Lactic acid, plasma   CK   Place Patient on a Cardiac Monitor   Initiate  Carrier Fluid Protocol   Consult to hospitalist   CBG monitoring, ED   CBG monitoring, ED   ED EKG   EKG 12-Lead   EKG 12-Lead     Following Medications were ordered in ER: Medications  azithromycin (ZITHROMAX) 500 mg in sodium chloride 0.9 % 250 mL IVPB (0 mg Intravenous Stopped 12/20/2020 1928)  insulin aspart (novoLOG) injection 5 Units (has no  administration in time range)    And  dextrose 50 % solution 50 mL (has no administration in time range)  cefTRIAXone (ROCEPHIN) 1 g in sodium chloride 0.9 % 100 mL IVPB (0 g Intravenous Stopped 12/29/2020 1837)        Consult Orders  (From admission, onward)           Start     Ordered   12/23/2020 1932  Consult to hospitalist  Paged [facility] by Lavone Orn  Once       Provider:  (Not yet assigned)  Question Answer Comment  Place call to: [facility]   Reason for Consult Admit      12/15/2020 1931             OTHER Significant initial  Findings:  labs showing:    Recent Labs  Lab 12/16/2020 1700 12/28/2020 2046  NA 141 145  K 6.1* 4.2  CO2 23  --   GLUCOSE 180*  --   BUN 62*  --   CREATININE 4.13*  --   CALCIUM 9.9  --     Cr    Up from baseline see below Lab Results  Component Value Date   CREATININE 4.13 (H) 01/03/2021   CREATININE 3.15 (H) 06/07/2020    Recent Labs  Lab 12/29/2020 1700  AST 471*  ALT 263*  ALKPHOS 130*  BILITOT 1.1  PROT 5.5*  ALBUMIN 3.1*   Lab Results  Component Value Date   CALCIUM 9.9 12/25/2020     Plt: Lab Results  Component Value Date   PLT 199 06/07/2020      COVID-19 Labs  No results for input(s): DDIMER, FERRITIN, LDH, CRP in the last 72 hours.  Lab Results  Component Value Date   SARSCOV2NAA NEGATIVE 01/07/2021     ABG    Component Value Date/Time   PHART 7.373 01/01/2021 2046   PCO2ART 45.7 12/20/2020 2046   PO2ART 50 (L) 01/10/2021 2046   HCO3 26.6 01/06/2021 2046   TCO2 28 12/25/2020 2046   O2SAT 83.0 12/25/2020 2046         Recent Labs  Lab  12/25/2020 2046 12/19/2020 2050  WBC  --  9.2  NEUTROABS  --  8.2*  HGB 8.8* 7.4*  HCT 26.0* 25.6*  MCV  --  87.4  PLT  --  308    HG/HCT    Down  from baseline see below    Component Value Date/Time   HGB 7.4 (L) 01/03/2021 2050   HCT 25.6 (L) 01/09/2021 2050   MCV 87.4 12/29/2020 2050      No results for input(s): LIPASE, AMYLASE in the last 168 hours. Recent Labs  Lab 01/03/2021 2049  AMMONIA 22     Cardiac Panel (last 3 results) Recent Labs    01/10/2021 1951  CKTOTAL 62     BNP (last 3 results) No results for input(s): BNP in the last 8760 hours.    DM  labs:  HbA1C: Recent Labs    12/19/2020 2002  HGBA1C 5.8*       CBG (last 3)  Recent Labs    01/11/2021 1556 01/07/2021 1937  GLUCAP 173* 126*          Cultures: No results found for: SDES, SPECREQUEST, CULT, REPTSTATUS   Radiological Exams on Admission: CT HEAD WO CONTRAST (5MM)  Result Date: 01/07/2021 CLINICAL DATA:  Mental status change, unknown cause mental status change EXAM: CT HEAD WITHOUT CONTRAST TECHNIQUE: Contiguous axial images were obtained  from the base of the skull through the vertex without intravenous contrast. COMPARISON:  Head CT 06/07/2020. FINDINGS: Brain: Heterogeneous density in the sella turcica of with sellar expansion, not significantly changed from prior head CT. No acute intracranial hemorrhage. Stable generalized atrophy and chronic small vessel ischemia. No subdural or extra-axial collection. No evidence of acute ischemia. No midline shift. Vascular: No hyperdense vessel. Skull: No skull fracture. Sinuses/Orbits: Total opacification of right mastoid air cells, unchanged from prior exam. Subtotal opacification of lower left mastoid air cells, also unchanged. Bilateral cataract resection. Other: None. IMPRESSION: 1. No acute intracranial abnormality. 2. Stable atrophy and chronic small vessel ischemia. 3. Heterogeneous density in the sella turcica with sellar expansion, not significantly  changed from prior head CT. Patient with reported history of prior pituitary surgery. This is incompletely assessed by CT. As clinically indicated, recommend further assessment with pituitary protocol MRI. 4. Bilateral mastoid effusions, unchanged from prior. Electronically Signed   By: Keith Rake M.D.   On: 12/15/2020 18:53   DG Chest Portable 1 View  Result Date: 12/22/2020 CLINICAL DATA:  Mental status change EXAM: PORTABLE CHEST 1 VIEW COMPARISON:  06/07/2020 FINDINGS: Low lung volumes. Interim development of patchy airspace disease and consolidation in the left thorax. Cardiomegaly with aortic atherosclerosis. IMPRESSION: 1. Interim development of airspace disease and consolidation in left thorax concerning for pneumonia 2. Cardiomegaly Electronically Signed   By: Donavan Foil M.D.   On: 01/05/2021 16:23   US Abdomen Limited RUQ (LIVER/GB)  Result Date: 12/31/2020 CLINICAL DATA:  Elevated LFTs EXAM: ULTRASOUND ABDOMEN LIMITED RIGHT UPPER QUADRANT COMPARISON:  None. FINDINGS: Gallbladder: No shadowing stone. Upper normal gallbladder wall thickness. Negative sonographic Murphy. Common bile duct: Diameter: 3 mm Liver: No focal lesion identified. Within normal limits in parenchymal echogenicity. Portal vein is patent on color Doppler imaging with normal direction of blood flow towards the liver. Other: Right kidney appears slightly echogenic. IMPRESSION: 1. Negative for gallstones or biliary dilatation. 2. Right kidney appears slightly echogenic consistent with medical renal disease Electronically Signed   By: Donavan Foil M.D.   On: 01/13/2021 21:09   _______________________________________________________________________________________________________ Latest  Blood pressure (!) 127/57, pulse (!) 42, temperature 97.6 F (36.4 C), temperature source Oral, resp. rate 18, height 5\' 3"  (1.6 m), weight 68.9 kg, SpO2 96 %.   Review of Systems:    Pertinent positives include:   fatigue  ,confusion  Constitutional:  No weight loss, night sweats, Fevers, chills, weight loss  HEENT:  No headaches, Difficulty swallowing,Tooth/dental problems,Sore throat,  No sneezing, itching, ear ache, nasal congestion, post nasal drip,  Cardio-vascular:  No chest pain, Orthopnea, PND, anasarca, dizziness, palpitations.no Bilateral lower extremity swelling  GI:  No heartburn, indigestion, abdominal pain, nausea, vomiting, diarrhea, change in bowel habits, loss of appetite, melena, blood in stool, hematemesis Resp:  no shortness of breath at rest. No dyspnea on exertion, No excess mucus, no productive cough, No non-productive cough, No coughing up of blood.No change in color of mucus.No wheezing. Skin:  no rash or lesions. No jaundice GU:  no dysuria, change in color of urine, no urgency or frequency. No straining to urinate.  No flank pain.  Musculoskeletal:  No joint pain or no joint swelling. No decreased range of motion. No back pain.  Psych:  No change in mood or affect. No depression or anxiety. No memory loss.  Neuro: no localizing neurological complaints, no tingling, no weakness, no double vision, no gait abnormality, no slurred speech, no   All systems  reviewed and apart from [place] all are negative _______________________________________________________________________________________________ Past Medical History:   Past Medical History:  Diagnosis Date   Chronic kidney disease (CKD) stage G4/A2, severely decreased glomerular filtration rate (GFR) between 15-29 mL/min/1.73 square meter and albuminuria creatinine ratio between 30-299 mg/g (HCC)    Dementia (HCC)    Hyperlipemia    Hypertension    Thyroid disease        Past Surgical History:  Procedure Laterality Date   BREAST SURGERY     PITUITARY SURGERY      Social History:  Ambulatory walker     reports that she has never smoked. She has never used smokeless tobacco. She reports previous alcohol use. She reports  that she does not use drugs.     Family History:  Hypertention DM in family  ____________________________________________________________________________________________ Allergies: Allergies  Allergen Reactions   Lisinopril Swelling    Facial/lip swelling      Prior to Admission medications   Medication Sig Start Date End Date Taking? Authorizing Provider  acetaminophen (TYLENOL) 650 MG CR tablet Take 650-1,300 mg by mouth every 8 (eight) hours as needed for pain.   Yes [provider]  allopurinol (ZYLOPRIM) 100 MG tablet Take 100 mg by mouth 2 (two) times daily. 06/18/20  Yes [provider]  amLODipine (NORVASC) 5 MG tablet Take 5 mg by mouth every morning. 12/08/20  Yes [provider]  ASHWAGANDHA PO Take 1 capsule by mouth every evening.   Yes [provider]  atorvastatin (LIPITOR) 40 MG tablet Take 40 mg by mouth at bedtime. 06/18/20  Yes [provider]  bimatoprost (LUMIGAN) 0.01 % SOLN Place 1 drop into both eyes at bedtime.   Yes [provider]  brimonidine (ALPHAGAN) 0.2 % ophthalmic solution Place 1 drop into both eyes every morning.   Yes [provider]  COCONUT OIL PO Take 15 mLs by mouth daily.   Yes [provider]  donepezil (ARICEPT) 23 MG TABS tablet Take 23 mg by mouth every evening. 12/04/20  Yes [provider]  folic acid (FOLVITE) 1 MG tablet Take 1 mg by mouth every morning. 06/18/20  Yes [provider]  furosemide (LASIX) 20 MG tablet Take 20 mg by mouth every other day. 06/18/20  Yes [provider]  hydrALAZINE (APRESOLINE) 50 MG tablet Take 50 mg by mouth 3 (three) times daily. 06/04/20  Yes [provider]  hydrocortisone (CORTEF) 5 MG tablet Take 5-15 mg by mouth See admin instructions. Take 3 tablets (15 mg) by mouth every morning and take 1 tablet (5 mg) after 1pm 06/18/20  Yes [provider]  ketotifen (ZADITOR) 0.025 % ophthalmic solution  Place 1 drop into both eyes 2 (two) times daily as needed (allergies).   Yes [provider]  levothyroxine (SYNTHROID) 50 MCG tablet Take 50 mcg by mouth daily before breakfast. 06/18/20  Yes [provider]  OVER THE COUNTER MEDICATION Take 5 mLs by mouth daily. Dandelion Root   Yes [provider]  OVER THE COUNTER MEDICATION Take 5 mLs by mouth every evening. Burdock Root   Yes [provider]  OVER THE COUNTER MEDICATION Take 1 capsule by mouth every morning. Alpha brain   Yes [provider]  polyethylene glycol (MIRALAX / GLYCOLAX) 17 g packet Take 17 g by mouth daily as needed (constipation).   Yes [provider]  repaglinide (PRANDIN) 0.5 MG tablet Take 0.5 mg by mouth 2 (two) times daily before a meal.  06/04/20  Yes [provider]    ___________________________________________________________________________________________________ Physical Exam: RJJOAC with BMI 12/20/2020 12/31/2020 12/15/2020  Height - - -  Weight - - -  BMI - - -  Systolic 166 063 016  Diastolic 57 010 68  Pulse 42 43 44     1. General:  in No  Acute distress    Chronically ill -appearing 2. Psychological:somnolent not Oriented 3. Head/ENT:    Dry Mucous Membranes                          Head Non traumatic, neck supple                           Poor Dentition 4. SKIN: normal  Skin turgor,  Skin clean Dry and intact no rash 5. Heart: Regular rate and rhythm no  Murmur, no Rub or gallop 6. Lungs:   no wheezes or crackles   7. Abdomen: Soft,  non-tender, Non distended   8. Lower extremities: no clubbing, cyanosis, bilateral leg edema 9. Neurologically Grossly intact, moving all 4 extremities equally   Tremor noted  10. MSK: Normal range of motion    Chart has been reviewed  ______________________________________________________________________________________________  Assessment/Plan 80 y.o. female with medical history significant of DM2,  dementia, CKD, thyroid disease, HTN, HLD covid infection Jan 2022, gout, chronic diastolic CHF  Pituitary disease   Admitted for  CAP acute resp failure with hypoxia, bradycardia  Present on Admission:  CAP (community acquired pneumonia) -  - will admit for treatment of CAP will start on appropriate antibiotic coverage.   Obtain:  sputum cultures,                  blood cultures if febrile or if decompensates.                   strep pneumo UA antigen,                  also check for Legionella.                 MRSA PCR                Provide oxygen as needed.    Acute respiratory failure with hypoxia (HCC) -  this patient has acute respiratory failure with Hypoxia   as documented by the presence of following: O2 saturatio< 90% on RA PaO2 <60 on RA Likely due to:  Pneumonia,  Provide O2 therapy and titrate as needed  Continuous pulse ox   check Pulse ox with ambulation prior to discharge   may need  TC consult for home O2 set up     Bradycardia - TSH WNL not on BB, consult cardiology ( Cardiology fellow on call felt non emergent) will consult in AM  Correct electrolytes   Acute metabolic encephalopathy -   - most likely multifactorial secondary to combination of  infection   mild dehydration secondary to decreased by mouth intake   - Will rehydrate   - treat underlining infection   - Hold contributing medication   - if no improvement may need further imaging to evaluate for CNS pathology pathology such as MRI of the brain  Pt had difficulty tolerating in the past  - neurological exam appears to be nonfocal but patient unable to cooperate fully   - ABG no evidence of hypercarbia  Noted hypoxia   -  no history of liver disease ammonia ordered    Acute on chronic renal failure  CKD (chronic kidney disease stage V (HCC) obtain urine electrolytes  Dementia (HCC)  Essential hypertension  Chronic diastolic CHF (congestive heart failure) ([place])  Will consult renal given worsening renal  function Pt has been told in the past not a candidate for HD family woujld like a second opinion Will consult renal   Elevated LFTs -   CK WNL, hepatitis panel, RUQ US - WNL await result repeat LFT showing elevation Will notify GI  Hyperlipidemia - hold lipitor given elevated LFT  Hypertension - allow permissive htn   Hyperkalemia  - possibly hemolyzed  Seems to be going down on repeat labs  Symptomatic Anemia - obtain anemia panel, hemoccult stool Transfuse 1 unit given that pt is symptomatic  DM2 -  - Order Sensitive SSI   -  check TSH and HgA1C  - Hold by mouth medications   Chronic hypothyroidism - - Check TSH continue home medications at current dose   Hypopituitarism - give stress dose steroids given soft BP  Hx of pituitary mass, was supposed to have MRI done but could not tolerate supposed to have it done as out under anesthesia   Chronic diastolic chf - some peripheral edema but initial soft bp and worsening renal function , hold off lasix resume when able to tolerate avoid fluid overload   Other plan as per orders.  DVT prophylaxis:  SCD      Code Status:  after extensive discussion with multiple family members her daughter who is MPOA would like pt to have limited code, OK BiPAP and pressors No defibrillation, CPR or intubation as per  family  I had personally discussed CODE STATUS with  family  I had spent 25 min discussing goals of care and CODE STATUS   Family Communication:   Family at  Bedside  plan of care was discussed   with  Son, Daughter,   Disposition Plan:    To home once workup is complete and patient is stable   Following barriers for discharge:                            Electrolytes corrected                               Anemia stable                                white count improving able to transition to PO antibiotics                             Will need to be able to tolerate PO                            Will likely need home  health, home O2, set up                           Will need consultants to evaluate patient prior to discharge                     Would benefit from PT/OT eval prior to DC  Ordered  Swallow eval - SLP ordered                                     Transition of care consulted                   Nutrition    consulted                                     Palliative care    consulted                                       Consults called: Cardiology consult sent email, nephrology paged GI LG sent epic chat msg  Admission status:  ED Disposition     ED Disposition  Admit   Condition  --   Comment  The patient appears reasonably stabilized for admission considering the current resources, flow, and capabilities available in the ED at this time, and I doubt any other [facility] requiring further screening and/or treatment in the ED prior to admission is  present.         inpatient     I Expect 2 midnight stay secondary to severity of patient's current illness need for inpatient interventions justified by the following:  hemodynamic instability despite optimal treatment (bradycardia)   Severe lab/radiological/exam abnormalities including:    Acute on chronic AKI, CAP and extensive comorbidities including:  DM2     CHF  CKD  dementia    That are currently affecting medical management.   I expect  patient to be hospitalized for 2 midnights requiring inpatient medical care.  Patient is at high risk for adverse outcome (such as loss of life or disability) if not treated.  Indication for inpatient stay as follows:  Severe change from baseline regarding mental status Hemodynamic instability despite maximal medical therapy,    inability to maintain oral hydration    New or worsening hypoxia  Need for IV antibiotics, IV fluids,     Level of care  progressive tele indefinitely please discontinue once patient no longer qualifies COVID-19 Labs    Lab Results   Component Value Date   [place] NEGATIVE 12/25/2020     Precautions: admitted as Covid Negative   PPE: Used by the provider:   N95  eye Goggles,  Gloves    Allison Wang 12/29/2020, 10:09 PM    [facility]     after 2 AM please page floor coverage PA If 7AM-7PM, please contact the day team taking care of the patient using [email or web address]   Patient was evaluated in the context of the global COVID-19 pandemic, which necessitated consideration that the patient might be at risk for infection with the SARS-CoV-2 virus that causes COVID-19. Institutional protocols and algorithms that pertain to the evaluation of patients at risk for COVID-19 are in a state of rapid change based on information released by regulatory bodies including the CDC and federal and state organizations. These policies and algorithms were followed during the patient's care.

## 2020-12-21 NOTE — ED Notes (Signed)
Attempted to obtain blood via bilateral IV, but unable to. Will see if lab can obtain CBC.

## 2020-12-21 NOTE — ED Provider Notes (Signed)
[facility] EMERGENCY DEPARTMENT Provider Note   CSN: [id number] Arrival date & time: 12/25/2020  1525     History Chief Complaint  Patient presents with   Altered Mental Status    Allison Wang is a 80 y.o. female.  HPI Patient is a 80 year old female with a past medical history of dementia, HLD, hypertension, thyroid disease that is presenting for change in mental status.  Daughter sent her to the ED today because she is lying in bed and was not feeling like herself.  Her last known normal was at 0200 this morning.  She has a history of dementia and is not alert to person, place or time.  Daughter reports that patient has been more weak than normal today.  She has been laying in bed throughout the day.  Daughter denies any fevers, chills, cough, chest pain, vomiting, diarrhea, change in urinary frequency.    Past Medical History:  Diagnosis Date   Chronic kidney disease (CKD) stage G4/A2, severely decreased glomerular filtration rate (GFR) between 15-29 mL/min/1.73 square meter and albuminuria creatinine ratio between 30-299 mg/g (HCC)    Dementia (HCC)    Hyperlipemia    Hypertension    Thyroid disease     Patient Active Problem List   Diagnosis Date Noted   CAP (community acquired pneumonia) 12/28/2020   Bradycardia 26/20/3559   Acute metabolic encephalopathy 74/16/3845   DM2 (diabetes mellitus, type 2) ([place]) 12/17/2020   CKD (chronic kidney disease), stage V ([place]) 12/24/2020   Dementia ([place]) 12/17/2020   Essential hypertension 12/30/2020   Chronic diastolic CHF (congestive heart failure) ([place]) 12/31/2020   Acute on chronic renal failure ([place]) 01/01/2021   Elevated LFTs 01/06/2021   Hyperkalemia 01/11/2021   Acute respiratory failure with hypoxia ([place]) 12/28/2020   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     Family History  Problem Relation Age of Onset   Hypertension Other     Social History   Tobacco Use   Smoking status: Never   Smokeless tobacco: Never  Substance Use Topics   Alcohol use: Not Currently   Drug use: Never    Home Medications Prior to Admission medications   Medication Sig Start Date End Date Taking? Authorizing Provider  acetaminophen (TYLENOL) 650 MG CR tablet Take 650-1,300 mg by mouth every 8 (eight) hours as needed for pain.   Yes [provider]  allopurinol (ZYLOPRIM) 100 MG tablet Take 100 mg by mouth 2 (two) times daily. 06/18/20  Yes [provider]  amLODipine (NORVASC) 5 MG tablet Take 5 mg by mouth every morning. 12/08/20  Yes [provider]  ASHWAGANDHA PO Take 1 capsule by mouth every evening.   Yes [provider]  atorvastatin (LIPITOR) 40 MG tablet Take 40 mg by mouth at bedtime. 06/18/20  Yes [provider]  bimatoprost (LUMIGAN) 0.01 % SOLN Place 1 drop into both eyes at bedtime.   Yes [provider]  brimonidine (ALPHAGAN) 0.2 % ophthalmic solution Place 1 drop into both eyes every morning.   Yes [provider]  COCONUT OIL PO Take 15 mLs by mouth daily.   Yes [provider]  donepezil (ARICEPT) 23 MG TABS tablet Take 23 mg by mouth every evening. 12/04/20  Yes [provider]  folic acid (FOLVITE) 1 MG tablet Take 1 mg by mouth every morning. 06/18/20  Yes [provider]  furosemide (LASIX) 20 MG tablet Take 20 mg by mouth every other day. 06/18/20  Yes [provider]  hydrALAZINE (APRESOLINE) 50 MG tablet Take 50 mg by mouth 3 (three) times daily. 06/04/20  Yes [provider]  hydrocortisone (CORTEF) 5 MG tablet Take 5-15 mg by mouth See admin instructions. Take 3 tablets (15 mg) by mouth every morning and take 1 tablet (5 mg) after 1pm 06/18/20  Yes [provider]  ketotifen  (ZADITOR) 0.025 % ophthalmic solution Place 1 drop into both eyes 2 (two) times daily as needed (allergies).   Yes [provider]  levothyroxine (SYNTHROID) 50 MCG tablet Take 50 mcg by mouth daily before breakfast. 06/18/20  Yes [provider]  OVER THE COUNTER MEDICATION Take 5 mLs by mouth daily. Dandelion Root   Yes [provider]  OVER THE COUNTER MEDICATION Take 5 mLs by mouth every evening. Burdock Root   Yes [provider]  OVER THE COUNTER MEDICATION Take 1 capsule by mouth every morning. Alpha brain   Yes [provider]  polyethylene glycol (MIRALAX / GLYCOLAX) 17 g packet Take 17 g by mouth daily as needed (constipation).   Yes [provider]  repaglinide (PRANDIN) 0.5 MG tablet Take 0.5 mg by mouth 2 (two) times daily before a meal. 06/04/20  Yes [provider]    Allergies    Lisinopril  Review of Systems   Review of Systems  Unable to perform ROS: Mental status change   Physical Exam Updated Vital Signs BP (!) 136/48   Pulse (!) 46   Temp 97.6 F (36.4 C) (Oral)   Resp 19   Ht 5\' 3"  (1.6 m)   Wt 68.9 kg   SpO2 97%   BMI 26.93 kg/m   Physical Exam Vitals and nursing note reviewed.  Constitutional:      General: She is not in acute distress.    Appearance: Normal appearance. She is normal weight. She is not ill-appearing.  HENT:     Head: Normocephalic and atraumatic.     Right Ear: External ear normal.     Left Ear: External ear normal.     Nose: Nose normal. No congestion.     Mouth/Throat:     Mouth: Mucous membranes are moist.     Pharynx: Oropharynx is clear. No oropharyngeal exudate or posterior oropharyngeal erythema.  Eyes:     General: No visual field deficit.    Extraocular Movements: Extraocular movements intact.     Conjunctiva/sclera: Conjunctivae normal.     Pupils: Pupils are equal, round, and reactive to light.  Neck:     Vascular: No carotid bruit.  Cardiovascular:      Rate and Rhythm: Normal rate and regular rhythm.     Pulses: Normal pulses.     Heart sounds: Normal heart sounds. No murmur heard. Pulmonary:     Effort: Pulmonary effort is normal. No respiratory distress.     Breath sounds: Normal breath sounds. No stridor. No wheezing, rhonchi or rales.  Chest:     Chest wall: No tenderness.  Abdominal:     General: Bowel sounds are normal. There is no distension.     Palpations: Abdomen is soft.     Tenderness: There is no abdominal tenderness. There is no right CVA tenderness, left CVA tenderness, guarding or rebound.  Musculoskeletal:        General: No swelling or tenderness. Normal range of motion.     Cervical back: Normal range of motion and neck supple. No rigidity, tenderness or bony tenderness.     Thoracic back: Normal. No tenderness or bony tenderness.     Lumbar back: Normal. No tenderness or bony tenderness.     Right lower leg: No edema.     Left lower leg: No edema.  Skin:    General: Skin is warm and dry.     Coloration: Skin is not jaundiced.  Neurological:     General: No focal deficit present.     Mental Status: She is disoriented.     Cranial Nerves: Cranial nerves are intact. No cranial nerve deficit, dysarthria or facial asymmetry.     Sensory: Sensation is intact. No sensory deficit.     Motor: Motor function is intact. No weakness.     Coordination: Coordination is intact. Finger-Nose-Finger Test normal.     Gait: Gait is intact.    ED Results / Procedures / Treatments   Labs (all labs ordered are listed, but only abnormal results are displayed) Labs Reviewed  COMPREHENSIVE METABOLIC PANEL - Abnormal; Notable for the following components:      Result Value   Potassium 6.1 (*)    Glucose, Bld 180 (*)    BUN 62 (*)    Creatinine, Ser 4.13 (*)    Total Protein 5.5 (*)    Albumin 3.1 (*)    AST 471 (*)    ALT 263 (*)    Alkaline Phosphatase 130 (*)    GFR, Estimated 10 (*)    All other components within normal  limits  URINALYSIS, ROUTINE W REFLEX MICROSCOPIC - Abnormal; Notable for the following components:   APPearance HAZY (*)    Protein, ur 30 (*)    All other components within normal limits  LACTIC ACID, PLASMA - Abnormal; Notable for the following components:   Lactic Acid, Venous 2.0 (*)    All other components within normal limits  LACTIC ACID, PLASMA - Abnormal; Notable for the following components:   Lactic Acid, Venous 2.5 (*)    All other components within normal limits  COMPREHENSIVE METABOLIC PANEL - Abnormal; Notable for the following components:   Glucose, Bld 131 (*)    BUN 63 (*)    Creatinine, Ser 4.16 (*)    Total Protein 6.0 (*)    Albumin 2.9 (*)    AST 327 (*)    ALT 234 (*)    GFR, Estimated 10 (*)    All other components within normal limits  HEMOGLOBIN A1C - Abnormal; Notable for the following components:   Hgb A1c MFr Bld 5.8 (*)    All other components within normal limits  PHOSPHORUS - Abnormal; Notable for the following components:   Phosphorus 4.7 (*)    All other components within normal limits  CBC WITH DIFFERENTIAL/PLATELET - Abnormal; Notable for the following components:   RBC 2.93 (*)    Hemoglobin 7.4 (*)    HCT 25.6 (*)    MCH 25.3 (*)    MCHC 28.9 (*)    RDW 19.9 (*)    nRBC 0.9 (*)    Neutro Abs 8.2 (*)    All other components within normal limits  BRAIN NATRIURETIC PEPTIDE - Abnormal; Notable for the following components:   B Natriuretic Peptide 913.4 (*)    All other components within normal limits  CBG  MONITORING, ED - Abnormal; Notable for the following components:   Glucose-Capillary 173 (*)    All other components within normal limits  CBG MONITORING, ED - Abnormal; Notable for the following components:   Glucose-Capillary 126 (*)    All other components within normal limits  I-STAT ARTERIAL BLOOD GAS, ED - Abnormal; Notable for the following components:   pO2, Arterial 50 (*)    HCT 26.0 (*)    Hemoglobin 8.8 (*)    All other  components within normal limits  POC OCCULT BLOOD, ED - Abnormal; Notable for the following components:   Fecal Occult Bld POSITIVE (*)    All other components within normal limits  TROPONIN I (HIGH SENSITIVITY) - Abnormal; Notable for the following components:   Troponin I (High Sensitivity) 98 (*)    All other components within normal limits  RESP PANEL BY RT-PCR (FLU A&B, COVID) ARPGX2  MRSA NEXT GEN BY PCR, NASAL  CULTURE, BLOOD (ROUTINE X 2)  CULTURE, BLOOD (ROUTINE X 2)  EXPECTORATED SPUTUM ASSESSMENT W GRAM STAIN, RFLX TO RESP C  URINE CULTURE  TSH  RAPID URINE DRUG SCREEN, HOSP PERFORMED  CK  LACTIC ACID, PLASMA  PROCALCITONIN  PROTIME-INR  STREP PNEUMONIAE URINARY ANTIGEN  AMMONIA  MAGNESIUM  CREATININE, URINE, RANDOM  SODIUM, URINE, RANDOM  CBC WITH DIFFERENTIAL/PLATELET  CORTISOL  BLOOD GAS, ARTERIAL  VITAMIN B12  FOLATE  IRON AND TIBC  FERRITIN  RETICULOCYTES  LEGIONELLA PNEUMOPHILA SEROGP 1 UR AG  HEPATITIS PANEL, ACUTE  PREALBUMIN  MAGNESIUM  PHOSPHORUS  CBC WITH DIFFERENTIAL/PLATELET  COMPREHENSIVE METABOLIC PANEL  PROTIME-INR  ACETAMINOPHEN LEVEL  CBG MONITORING, ED  TYPE AND SCREEN  PREPARE RBC (CROSSMATCH)  ABO/RH  TROPONIN I (HIGH SENSITIVITY)  TROPONIN I (HIGH SENSITIVITY)    EKG EKG Interpretation  Date/Time:  Monday December 21 2020 15:57:27 EDT Ventricular Rate:  38 PR Interval:  203 QRS Duration: 175 QT Interval:  532 QTC Calculation: 423 R Axis:   -60 Text Interpretation: Sinus or ectopic atrial bradycardia Nonspecific IVCD with LAD LVH with secondary repolarization abnormality Anterolateral infarct, age indeterminate Since last tracing rate slower Confirmed by Isla Pence [phone]) on 01/08/2021 4:47:18 PM  Radiology CT HEAD WO CONTRAST (5MM)  Result Date: 12/28/2020 CLINICAL DATA:  Mental status change, unknown cause mental status change EXAM: CT HEAD WITHOUT CONTRAST TECHNIQUE: Contiguous axial images were obtained from the base  of the skull through the vertex without intravenous contrast. COMPARISON:  Head CT 06/07/2020. FINDINGS: Brain: Heterogeneous density in the sella turcica of with sellar expansion, not significantly changed from prior head CT. No acute intracranial hemorrhage. Stable generalized atrophy and chronic small vessel ischemia. No subdural or extra-axial collection. No evidence of acute ischemia. No midline shift. Vascular: No hyperdense vessel. Skull: No skull fracture. Sinuses/Orbits: Total opacification of right mastoid air cells, unchanged from prior exam. Subtotal opacification of lower left mastoid air cells, also unchanged. Bilateral cataract resection. Other: None. IMPRESSION: 1. No acute intracranial abnormality. 2. Stable atrophy and chronic small vessel ischemia. 3. Heterogeneous density in the sella turcica with sellar expansion, not significantly changed from prior head CT. Patient with reported history of prior pituitary surgery. This is incompletely assessed by CT. As clinically indicated, recommend further assessment with pituitary protocol MRI. 4. Bilateral mastoid effusions, unchanged from prior. Electronically Signed   By: Keith Rake M.D.   On: 12/15/2020 18:53   DG Chest Portable 1 View  Result Date: 12/25/2020 CLINICAL DATA:  Mental status change EXAM: PORTABLE CHEST 1  VIEW COMPARISON:  06/07/2020 FINDINGS: Low lung volumes. Interim development of patchy airspace disease and consolidation in the left thorax. Cardiomegaly with aortic atherosclerosis. IMPRESSION: 1. Interim development of airspace disease and consolidation in left thorax concerning for pneumonia 2. Cardiomegaly Electronically Signed   By: Donavan Foil M.D.   On: 12/23/2020 16:23   US Abdomen Limited RUQ (LIVER/GB)  Result Date: 01/03/2021 CLINICAL DATA:  Elevated LFTs EXAM: ULTRASOUND ABDOMEN LIMITED RIGHT UPPER QUADRANT COMPARISON:  None. FINDINGS: Gallbladder: No shadowing stone. Upper normal gallbladder wall thickness.  Negative sonographic Murphy. Common bile duct: Diameter: 3 mm Liver: No focal lesion identified. Within normal limits in parenchymal echogenicity. Portal vein is patent on color Doppler imaging with normal direction of blood flow towards the liver. Other: Right kidney appears slightly echogenic. IMPRESSION: 1. Negative for gallstones or biliary dilatation. 2. Right kidney appears slightly echogenic consistent with medical renal disease Electronically Signed   By: Donavan Foil M.D.   On: 12/18/2020 21:09    Procedures Procedures   Medications Ordered in ED Medications  azithromycin (ZITHROMAX) 500 mg in sodium chloride 0.9 % 250 mL IVPB (0 mg Intravenous Stopped 12/30/2020 1928)  insulin aspart (novoLOG) injection 5 Units (5 Units Intravenous Not Given 12/20/2020 2158)    And  dextrose 50 % solution 50 mL (50 mLs Intravenous Not Given 01/06/2021 2158)  cefTRIAXone (ROCEPHIN) 2 g in sodium chloride 0.9 % 100 mL IVPB (has no administration in time range)  insulin aspart (novoLOG) injection 0-9 Units (0 Units Subcutaneous Not Given 01/13/2021 2348)  hydrocortisone sodium succinate (SOLU-CORTEF) 100 MG injection 100 mg (100 mg Intravenous Given 01/11/2021 2232)  albuterol (PROVENTIL) (2.5 MG/3ML) 0.083% nebulizer solution 2.5 mg (has no administration in time range)  0.9 %  sodium chloride infusion (Manually program via Guardrails IV Fluids) (0 mLs Intravenous Hold 01/07/2021 2212)  latanoprost (XALATAN) 0.005 % ophthalmic solution 1 drop (1 drop Both Eyes Given 01/13/2021 2348)  brimonidine (ALPHAGAN) 0.2 % ophthalmic solution 1 drop (has no administration in time range)  levothyroxine (SYNTHROID) tablet 50 mcg (has no administration in time range)  0.9 %  sodium chloride infusion (75 mL/hr Intravenous New Bag/Given 01/07/2021 2232)  polyethylene glycol (MIRALAX / GLYCOLAX) packet 17 g (has no administration in time range)  cefTRIAXone (ROCEPHIN) 1 g in sodium chloride 0.9 % 100 mL IVPB (0 g Intravenous Stopped 01/05/2021 1837)     ED Course  I have reviewed the triage vital signs and the nursing notes.  Pertinent labs & imaging results that were available during my care of the patient were reviewed by me and considered in my medical decision making (see chart for details).    MDM Rules/Calculators/A&P                         [name]. Freeze is a 80 y.o. female with a past medical history of dementia, thyroid disease who is presenting for altered mental status.  Daughter patient states that her last known normal was at 0200 this morning.  She has been more tired than normal and lying in bed.  She has been more weak.  Patient's vital signs are notable for her heart rate being bradycardic, however blood pressures are stable.  She is on 3 L of oxygen and she does not require oxygen at home.    Her CXR was concerning for pneumonia and she was given IV rocephin and azithromycin. Her CT head showed no intracranial abnormality. Her covid test was  negative. Her lactate was 2. Her CMP was notable for a potassium of 6.1. A repeat CMP was performed that showed a normal potassium. Her CK and TSH were unremarkable. Cardiology was consulted for the patient bradycardia. They recommend watching the patient and if her blood pressure changes then to reach back out to them.   Patient admitted to hospitalist for further evaluation .  Patient daughter is at the bedside.   The plan for this patient was discussed with Dr. Gilford Raid, who voiced agreement and who oversaw evaluation and treatment of this patient.   Final Clinical Impression(s) / ED Diagnoses Final diagnoses:  Altered mental status, unspecified altered mental status type  Community acquired pneumonia, unspecified laterality    Rx / DC Orders ED Discharge Orders     None        Doretha Sou, MD 12/22/20 Aretha Parrot    Isla Pence, MD 12/22/20 1758

## 2020-12-22 DIAGNOSIS — N189 Chronic kidney disease, unspecified: Secondary | ICD-10-CM

## 2020-12-22 DIAGNOSIS — J189 Pneumonia, unspecified organism: Secondary | ICD-10-CM | POA: Diagnosis not present

## 2020-12-22 DIAGNOSIS — R001 Bradycardia, unspecified: Secondary | ICD-10-CM

## 2020-12-22 DIAGNOSIS — N179 Acute kidney failure, unspecified: Secondary | ICD-10-CM

## 2020-12-22 DIAGNOSIS — N185 Chronic kidney disease, stage 5: Secondary | ICD-10-CM

## 2020-12-22 DIAGNOSIS — R7989 Other specified abnormal findings of blood chemistry: Secondary | ICD-10-CM

## 2020-12-22 DIAGNOSIS — I5032 Chronic diastolic (congestive) heart failure: Secondary | ICD-10-CM | POA: Diagnosis not present

## 2020-12-22 DIAGNOSIS — J9601 Acute respiratory failure with hypoxia: Secondary | ICD-10-CM | POA: Diagnosis not present

## 2020-12-22 DIAGNOSIS — Z515 Encounter for palliative care: Secondary | ICD-10-CM

## 2020-12-22 DIAGNOSIS — N170 Acute kidney failure with tubular necrosis: Secondary | ICD-10-CM | POA: Diagnosis not present

## 2020-12-22 DIAGNOSIS — R4182 Altered mental status, unspecified: Secondary | ICD-10-CM | POA: Diagnosis not present

## 2020-12-22 DIAGNOSIS — F0390 Unspecified dementia without behavioral disturbance: Secondary | ICD-10-CM

## 2020-12-22 DIAGNOSIS — G9341 Metabolic encephalopathy: Secondary | ICD-10-CM | POA: Diagnosis not present

## 2020-12-22 DIAGNOSIS — Z7189 Other specified counseling: Secondary | ICD-10-CM

## 2020-12-22 LAB — RETICULOCYTES
Immature Retic Fract: 28 % — ABNORMAL HIGH (ref 2.3–15.9)
RBC.: 3.78 MIL/uL — ABNORMAL LOW (ref 3.87–5.11)
Retic Count, Absolute: 82.8 10*3/uL (ref 19.0–186.0)
Retic Ct Pct: 2.2 % (ref 0.4–3.1)

## 2020-12-22 LAB — BLOOD CULTURE ID PANEL (REFLEXED) - BCID2

## 2020-12-22 LAB — CBC WITH DIFFERENTIAL/PLATELET
Abs Immature Granulocytes: 0.03 10*3/uL (ref 0.00–0.07)
Basophils Absolute: 0 10*3/uL (ref 0.0–0.1)
Basophils Relative: 0 %
Eosinophils Absolute: 0 10*3/uL (ref 0.0–0.5)
Eosinophils Relative: 0 %
HCT: 32.2 % — ABNORMAL LOW (ref 36.0–46.0)
Hemoglobin: 9.2 g/dL — ABNORMAL LOW (ref 12.0–15.0)
Immature Granulocytes: 0 %
Lymphocytes Relative: 4 %
Lymphs Abs: 0.4 10*3/uL — ABNORMAL LOW (ref 0.7–4.0)
MCH: 25 pg — ABNORMAL LOW (ref 26.0–34.0)
MCHC: 28.6 g/dL — ABNORMAL LOW (ref 30.0–36.0)
MCV: 87.5 fL (ref 80.0–100.0)
Monocytes Absolute: 0.1 10*3/uL (ref 0.1–1.0)
Monocytes Relative: 1 %
Neutro Abs: 9.1 10*3/uL — ABNORMAL HIGH (ref 1.7–7.7)
Neutrophils Relative %: 95 %
Platelets: 330 10*3/uL (ref 150–400)
RBC: 3.68 MIL/uL — ABNORMAL LOW (ref 3.87–5.11)
RDW: 19.9 % — ABNORMAL HIGH (ref 11.5–15.5)
WBC: 9.6 10*3/uL (ref 4.0–10.5)
nRBC: 0.6 % — ABNORMAL HIGH (ref 0.0–0.2)

## 2020-12-22 LAB — GLUCOSE, CAPILLARY
Glucose-Capillary: 123 mg/dL — ABNORMAL HIGH (ref 70–99)
Glucose-Capillary: 124 mg/dL — ABNORMAL HIGH (ref 70–99)

## 2020-12-22 LAB — CBG MONITORING, ED
Glucose-Capillary: 109 mg/dL — ABNORMAL HIGH (ref 70–99)
Glucose-Capillary: 132 mg/dL — ABNORMAL HIGH (ref 70–99)
Glucose-Capillary: 56 mg/dL — ABNORMAL LOW (ref 70–99)
Glucose-Capillary: 79 mg/dL (ref 70–99)
Glucose-Capillary: 90 mg/dL (ref 70–99)

## 2020-12-22 LAB — HEPATITIS PANEL, ACUTE
HCV Ab: NONREACTIVE
Hep A IgM: NONREACTIVE
Hep B C IgM: NONREACTIVE
Hepatitis B Surface Ag: NONREACTIVE

## 2020-12-22 LAB — IRON AND TIBC
Iron: 19 ug/dL — ABNORMAL LOW (ref 28–170)
Saturation Ratios: 7 % — ABNORMAL LOW (ref 10.4–31.8)
TIBC: 269 ug/dL (ref 250–450)
UIBC: 250 ug/dL

## 2020-12-22 LAB — COMPREHENSIVE METABOLIC PANEL
ALT: 219 U/L — ABNORMAL HIGH (ref 0–44)
AST: 218 U/L — ABNORMAL HIGH (ref 15–41)
Albumin: 3.4 g/dL — ABNORMAL LOW (ref 3.5–5.0)
Alkaline Phosphatase: 131 U/L — ABNORMAL HIGH (ref 38–126)
Anion gap: 12 (ref 5–15)
BUN: 65 mg/dL — ABNORMAL HIGH (ref 8–23)
CO2: 24 mmol/L (ref 22–32)
Calcium: 10 mg/dL (ref 8.9–10.3)
Chloride: 109 mmol/L (ref 98–111)
Creatinine, Ser: 4.26 mg/dL — ABNORMAL HIGH (ref 0.44–1.00)
GFR, Estimated: 10 mL/min — ABNORMAL LOW (ref >60)
Glucose, Bld: 135 mg/dL — ABNORMAL HIGH (ref 70–99)
Potassium: 4.3 mmol/L (ref 3.5–5.1)
Sodium: 145 mmol/L (ref 135–145)
Total Bilirubin: 0.7 mg/dL (ref 0.3–1.2)
Total Protein: 6.7 g/dL (ref 6.5–8.1)

## 2020-12-22 LAB — PREALBUMIN: Prealbumin: 22.4 mg/dL (ref 18–38)

## 2020-12-22 LAB — TROPONIN I (HIGH SENSITIVITY)
Troponin I (High Sensitivity): 105 ng/L (ref <18)
Troponin I (High Sensitivity): 108 ng/L (ref <18)

## 2020-12-22 LAB — FOLATE: Folate: 36.1 ng/mL (ref >5.9)

## 2020-12-22 LAB — CORTISOL: Cortisol, Plasma: >100 ug/dL

## 2020-12-22 LAB — PROTIME-INR
INR: 1 (ref 0.8–1.2)
Prothrombin Time: 13.3 seconds (ref 11.4–15.2)

## 2020-12-22 LAB — ACETAMINOPHEN LEVEL: Acetaminophen (Tylenol), Serum: <10 ug/mL — ABNORMAL LOW (ref 10–30)

## 2020-12-22 LAB — VITAMIN B12: Vitamin B-12: 775 pg/mL (ref 180–914)

## 2020-12-22 LAB — FERRITIN: Ferritin: 53 ng/mL (ref 11–307)

## 2020-12-22 LAB — PREPARE RBC (CROSSMATCH)

## 2020-12-22 LAB — MAGNESIUM: Magnesium: 2.5 mg/dL — ABNORMAL HIGH (ref 1.7–2.4)

## 2020-12-22 LAB — PHOSPHORUS: Phosphorus: 5.6 mg/dL — ABNORMAL HIGH (ref 2.5–4.6)

## 2020-12-22 MED ORDER — DEXTROSE 50 % IV SOLN
12.5000 g | INTRAVENOUS | Status: AC
  Administered 2020-12-22: 12.5 g via INTRAVENOUS

## 2020-12-22 NOTE — Progress Notes (Signed)
PHARMACY - PHYSICIAN COMMUNICATION CRITICAL VALUE ALERT - BLOOD CULTURE IDENTIFICATION (BCID)  [name]. Allison Wang is an 80 y.o. female who presented to [facility] on 01/13/2021 with a chief complaint of altered mental status and weakness.   Assessment:   8/8 Bcx: GPC 8/9 BCID identified 3/4 bottles with Staphylococcus epidermidis Likely represent contaminant and blood draw from same sites  Name of physician (or Provider) Contacted: Dr. Erlinda Hong  Current antibiotics:  Ceftriaxone 2g IV q24h Azithromycin 500 mg IV q24h  Changes to prescribed antibiotics recommended:  Staph epi likely representing a contaminant.  No additional antibiotics recommended at this time.   Results for orders placed or performed during the hospital encounter of 01/11/2021  Blood Culture ID Panel (Reflexed) (Collected: 12/16/2020  5:30 PM)  Result Value Ref Range   Enterococcus faecalis NOT DETECTED NOT DETECTED   Enterococcus Faecium NOT DETECTED NOT DETECTED   Listeria monocytogenes NOT DETECTED NOT DETECTED   Staphylococcus species DETECTED (A) NOT DETECTED   Staphylococcus aureus (BCID) NOT DETECTED NOT DETECTED   Staphylococcus epidermidis DETECTED (A) NOT DETECTED   Staphylococcus lugdunensis NOT DETECTED NOT DETECTED   Streptococcus species NOT DETECTED NOT DETECTED   Streptococcus agalactiae NOT DETECTED NOT DETECTED   Streptococcus pneumoniae NOT DETECTED NOT DETECTED   Streptococcus pyogenes NOT DETECTED NOT DETECTED   A.calcoaceticus-baumannii NOT DETECTED NOT DETECTED   Bacteroides fragilis NOT DETECTED NOT DETECTED   Enterobacterales NOT DETECTED NOT DETECTED   Enterobacter cloacae complex NOT DETECTED NOT DETECTED   Escherichia coli NOT DETECTED NOT DETECTED   Klebsiella aerogenes NOT DETECTED NOT DETECTED   Klebsiella oxytoca NOT DETECTED NOT DETECTED   Klebsiella pneumoniae NOT DETECTED NOT DETECTED   Proteus species NOT DETECTED NOT DETECTED   Salmonella species NOT DETECTED NOT DETECTED    Serratia marcescens NOT DETECTED NOT DETECTED   Haemophilus influenzae NOT DETECTED NOT DETECTED   Neisseria meningitidis NOT DETECTED NOT DETECTED   Pseudomonas aeruginosa NOT DETECTED NOT DETECTED   Stenotrophomonas maltophilia NOT DETECTED NOT DETECTED   Candida albicans NOT DETECTED NOT DETECTED   Candida auris NOT DETECTED NOT DETECTED   Candida glabrata NOT DETECTED NOT DETECTED   Candida krusei NOT DETECTED NOT DETECTED   Candida parapsilosis NOT DETECTED NOT DETECTED   Candida tropicalis NOT DETECTED NOT DETECTED   Cryptococcus neoformans/gattii NOT DETECTED NOT DETECTED   Methicillin resistance mecA/C NOT DETECTED NOT DETECTED    Lestine Box, PharmD PGY2 Infectious Diseases Pharmacy Resident   Please check [email or web address] for unit-specific pharmacy phone numbers

## 2020-12-22 NOTE — ED Notes (Signed)
Lab came and stuck pt 3 times to obtain labs and ED resident looked with [place] and didn't see anything to stick. Dr. Roel Cluck made aware.

## 2020-12-22 NOTE — ED Notes (Signed)
Pt appears to be asleep. NAD noted. Daughter remains at bedside.

## 2020-12-22 NOTE — ED Notes (Signed)
I stayed with pt first 15 minutes of blood transfusion.

## 2020-12-22 NOTE — Progress Notes (Signed)
PROGRESS NOTE    Allison Wang. Marvel Plan  AJO:[id number] DOB: [birth year] DOA: 12/20/2020 PCP: Roselee Nova, MD    Chief Complaint  Patient presents with   Altered Mental Status    Brief Narrative:  80 y.o. female with medical history significant of covid infection Jan 2022, gout,thyroid disease, HTN, HLD, NIDDM2, CKDV, chronic dCHF,  Pituitary disease, dementia,    Admitted for  CAP acute resp failure with hypoxia, bradycardia, aki, hyperkalemia Nephrology donot think patient is a candidate for dialysis, also seen by cardiology, both recommended palliative care consult   Subjective:  Not eating, has hypoglycemia  Assessment & Plan:   Active Problems:   CAP (community acquired pneumonia)   Bradycardia   Acute metabolic encephalopathy   DM2 (diabetes mellitus, type 2) (HCC)   CKD (chronic kidney disease), stage V (HCC)   Dementia (HCC)   Essential hypertension   Chronic diastolic CHF (congestive heart failure) (HCC)   Acute on chronic renal failure (HCC)   Elevated LFTs   Hyperkalemia   Acute respiratory failure with hypoxia (HCC)   Anemia due to chronic kidney disease   Symptomatic anemia   Acute metabolic encephalopathy with baseline dementia -does not recognize family members at baseline.   -daughter reports patient is more sleepy than normal -could be from penumonia and aki  Community-acquired pneumonia/acute hypoxic respiratory failure -Chest x-ray "Interim development of airspace disease and consolidation in left thorax concerning for pneumonia" -Cannot rule out aspiration pneumonia, request speech eval -Continue current antibiotics, aspiration precaution, wean oxygen as tolerated  Elevation of LFT -CK unremarkable, right upper quadrant ultrasound no acute findings -Seen by GI, thought LFT elevation due to infection, no plan for ERCP -GI recommend treat underlying pneumonia, trend LFT  AKI on CKD 5 -Case discussed with nephrology Dr. Reeves Dam who will see  patient in consult today They was told she is not a candidate for dialysis in the past  Acute on chronic anemia -Seen by GI do not think she is a candidate for EGD colonoscopy -Likely component of anemia of chronic disease -She received 1 unit of PRBC, hemoglobin increased from 7.4-9.2 -Monitor CBC   Sinus bradycardia/elevation of troponin Seen by cardiology Thought due to AKI hyperkalemia Troponin elevation due to demand ischemia, can follow outpatient for ischemic work-up if patient and family wishes Avoid AV nodal blocking agent, cardiology recommend discontinue aricept, cardiology recommend palliative care consult   Chronic diastolic CHF Seen by cardiology Patient not a candidate for beta-blocker due to bradycardia Okay to resume Lasix 20 mg daily once infection resolves  Non-insulin-dependent type 2 diabetes On DPP 4 inhibitor at home Currently on ssi  With hypoglycemia due to poor oral intake, on hypoglycemic protocol  Hypothyroidism Continue Synthroid  Pituitary tumor -continue hydrocortisone  FTT: needs goals of care discussion, palliative care consulted    Body mass index is 26.93 kg/m.Marland Kitchen     Unresulted Labs (From admission, onward)     Start     Ordered   12/22/20 0800  Pathologist smear review  Once,   R        12/22/20 0800   12/23/2020 2141  Urine Culture  Once,   STAT       Question:  Indication  Answer:  Sepsis   01/01/2021 2140   12/24/2020 2028  Expectorated Sputum Assessment w Gram Stain, Rflx to Resp Cult  Once,   R        12/29/2020 2028   12/18/2020 2028  Legionella Pneumophila Serogp 1  Ur Ag  Once,   STAT        12/23/2020 2028   12/24/2020 2011  Blood gas, arterial  Once,   R        12/25/2020 2010   12/18/2020 1730  Blood Culture ID Panel (Reflexed)  Once,   STAT        12/25/2020 1730   12/19/2020 1543  CBC with Differential  ONCE - STAT,   STAT        12/20/2020 1544   Unscheduled  Occult blood card to lab, stool RN will collect  As needed,   R     Question  Answer Comment  Specimen to be collected by: RN will collect   Release to patient Immediate      01/13/2021 2031              DVT prophylaxis: SCDs Start: 12/15/2020 2158   Code Status: partial code Family Communication: none at bedside  Disposition:   Status is: Inpatient   Dispo: The patient is from: home              Anticipated d/c is to: TBD              Anticipated d/c date is: TBD               Consultants:  Cardiology GI Nephrology Palliative care  Procedures:  none  Antimicrobials:    Anti-infectives (From admission, onward)    Start     Dose/Rate Route Frequency Ordered Stop   12/22/20 1900  cefTRIAXone (ROCEPHIN) 2 g in sodium chloride 0.9 % 100 mL IVPB        2 g 200 mL/hr over 30 Minutes Intravenous Every 24 hours 12/17/2020 2001 12/27/20 1859   12/31/2020 1645  cefTRIAXone (ROCEPHIN) 1 g in sodium chloride 0.9 % 100 mL IVPB        1 g 200 mL/hr over 30 Minutes Intravenous  Once 12/25/2020 1632 12/31/2020 1837   01/09/2021 1645  azithromycin (ZITHROMAX) 500 mg in sodium chloride 0.9 % 250 mL IVPB        500 mg 250 mL/hr over 60 Minutes Intravenous Every 24 hours 01/08/2021 1632            Objective: Vitals:   12/22/20 0815 12/22/20 0830 12/22/20 0845 12/22/20 1001  BP: (!) 145/59 (!) 137/56 (!) 155/58 (!) 145/59  Pulse: 64 61 63 61  Resp: 17 18 12 16   Temp:      TempSrc:      SpO2: 99% 100% 99% 97%  Weight:      Height:        Intake/Output Summary (Last 24 hours) at 12/22/2020 1026 Last data filed at 12/22/2020 0816 Gross per 24 hour  Intake 1107.67 ml  Output --  Net 1107.67 ml   Filed Weights   12/30/2020 1555  Weight: 68.9 kg    Examination:  General exam: demented, lethargic, but open eyes spontanously  Respiratory system: diminished, no wheezing, some crackles left side ,  Respiratory effort normal. Cardiovascular system: S1 & S2 heard, RRR. No JVD, no murmur, No pedal edema. Gastrointestinal system: Abdomen is nondistended, soft  and nontender.  Normal bowel sounds heard. Central nervous system: demented, lethargic Extremities:no edema, appear to have foot drop on the right  Skin: No rashes, lesions or ulcers Psychiatry: demented, lethargic, no agitation     Data Reviewed: I have personally reviewed following labs and imaging studies  CBC: Recent Labs  Lab  12/27/2020 2046 12/18/2020 2050 12/22/20 0800  WBC  --  9.2 9.6  NEUTROABS  --  8.2* 9.1*  HGB 8.8* 7.4* 9.2*  HCT 26.0* 25.6* 32.2*  MCV  --  87.4 87.5  PLT  --  308 725    Basic Metabolic Panel: Recent Labs  Lab 01/13/2021 1700 12/24/2020 2046 12/23/2020 2049 12/22/20 0800  NA 141 145 142 145  K 6.1* 4.2 4.1 4.3  CL 106  --  108 109  CO2 23  --  24 24  GLUCOSE 180*  --  131* 135*  BUN 62*  --  63* 65*  CREATININE 4.13*  --  4.16* 4.26*  CALCIUM 9.9  --  9.8 10.0  MG  --   --  2.4 2.5*  PHOS  --   --  4.7* 5.6*    GFR: Estimated Creatinine Clearance: 9.8 mL/min (A) (by C-G formula based on SCr of 4.26 mg/dL (H)).  Liver Function Tests: Recent Labs  Lab 12/25/2020 1700 12/30/2020 2049 12/22/20 0800  AST 471* 327* 218*  ALT 263* 234* 219*  ALKPHOS 130* 115 131*  BILITOT 1.1 0.4 0.7  PROT 5.5* 6.0* 6.7  ALBUMIN 3.1* 2.9* 3.4*    CBG: Recent Labs  Lab 12/29/2020 1556 12/16/2020 1937 01/11/2021 2348 12/22/20 0412 12/22/20 0726  GLUCAP 173* 126* 99 109* 132*     Recent Results (from the past 240 hour(s))  Resp Panel by RT-PCR (Flu A&B, Covid) Nasopharyngeal Swab     Status: None   Collection Time: 12/23/2020  4:15 PM   Specimen: Nasopharyngeal Swab; Nasopharyngeal(NP) swabs in vial transport medium  Result Value Ref Range Status   SARS Coronavirus 2 by RT PCR NEGATIVE NEGATIVE Final    Comment: (NOTE) SARS-CoV-2 target nucleic acids are NOT DETECTED.  The SARS-CoV-2 RNA is generally detectable in upper respiratory specimens during the acute phase of infection. The lowest concentration of SARS-CoV-2 viral copies this assay can detect  is 138 copies/mL. A negative result does not preclude SARS-Cov-2 infection and should not be used as the sole basis for treatment or other patient management decisions. A negative result may occur with  improper specimen collection/handling, submission of specimen other than nasopharyngeal swab, presence of viral mutation(s) within the areas targeted by this assay, and inadequate number of viral copies(<138 copies/mL). A negative result must be combined with clinical observations, patient history, and epidemiological information. The expected result is Negative.  Fact Sheet for Patients:  EntrepreneurPulse.com.au  Fact Sheet for Healthcare Providers:  IncredibleEmployment.be  This test is no t yet approved or cleared by the [place] FDA and  has been authorized for detection and/or diagnosis of SARS-CoV-2 by FDA under an Emergency Use Authorization (EUA). This EUA will remain  in effect (meaning this test can be used) for the duration of the COVID-19 declaration under Section 564(b)(1) of the Act, 21 U.S.C.section 360bbb-3(b)(1), unless the authorization is terminated  or revoked sooner.       Influenza A by PCR NEGATIVE NEGATIVE Final   Influenza B by PCR NEGATIVE NEGATIVE Final    Comment: (NOTE) The Xpert Xpress SARS-CoV-2/FLU/RSV plus assay is intended as an aid in the diagnosis of influenza from Nasopharyngeal swab specimens and should not be used as a sole basis for treatment. Nasal washings and aspirates are unacceptable for Xpert Xpress SARS-CoV-2/FLU/RSV testing.  Fact Sheet for Patients: EntrepreneurPulse.com.au  Fact Sheet for Healthcare Providers: IncredibleEmployment.be  This test is not yet approved or cleared by the [place] and  has been authorized for detection and/or diagnosis of SARS-CoV-2 by FDA under an Emergency Use Authorization (EUA). This EUA will remain in effect  (meaning this test can be used) for the duration of the COVID-19 declaration under Section 564(b)(1) of the Act, 21 U.S.C. section 360bbb-3(b)(1), unless the authorization is terminated or revoked.  Performed at [facility], [place] [address], [place], [place] [address]   Culture, blood (routine x 2)     Status: None (Preliminary result)   Collection Time: 01/10/2021  5:30 PM   Specimen: BLOOD  Result Value Ref Range Status   Specimen Description BLOOD RIGHT ANTECUBITAL  Final   Special Requests   Final    BOTTLES DRAWN AEROBIC AND ANAEROBIC Blood Culture results may not be optimal due to an inadequate volume of blood received in culture bottles   Culture  Setup Time   Final    GRAM POSITIVE COCCI ANAEROBIC BOTTLE ONLY Organism ID to follow Performed at [facility], [place] [address] [place], [place] [address]    Culture GRAM POSITIVE COCCI  Final   Report Status PENDING  Incomplete  Culture, blood (routine x 2)     Status: None (Preliminary result)   Collection Time: 01/05/2021  5:30 PM   Specimen: BLOOD  Result Value Ref Range Status   Specimen Description BLOOD RIGHT ANTECUBITAL  Final   Special Requests   Final    BOTTLES DRAWN AEROBIC AND ANAEROBIC Blood Culture results may not be optimal due to an inadequate volume of blood received in culture bottles   Culture  Setup Time   Final    GRAM POSITIVE COCCI IN BOTH AEROBIC AND ANAEROBIC BOTTLES Performed at [facility], [place] [address] [place], [place] [address]    Culture PENDING  Incomplete   Report Status PENDING  Incomplete  MRSA Next Gen by PCR, Nasal     Status: None   Collection Time: 01/08/2021  9:59 PM  Result Value Ref Range Status   MRSA by PCR Next Gen NOT DETECTED NOT DETECTED Final    Comment: (NOTE) The GeneXpert MRSA Assay (FDA approved for NASAL specimens only), is one component of a comprehensive MRSA colonization surveillance program. It is not intended to diagnose MRSA infection nor to  guide or monitor treatment for MRSA infections. Test performance is not FDA approved in patients less than 5 years old. Performed at [facility], [place] [address], [place], [place] [address]          Radiology Studies: CT HEAD WO CONTRAST (5MM)  Result Date: 12/31/2020 CLINICAL DATA:  Mental status change, unknown cause mental status change EXAM: CT HEAD WITHOUT CONTRAST TECHNIQUE: Contiguous axial images were obtained from the base of the skull through the vertex without intravenous contrast. COMPARISON:  Head CT 06/07/2020. FINDINGS: Brain: Heterogeneous density in the sella turcica of with sellar expansion, not significantly changed from prior head CT. No acute intracranial hemorrhage. Stable generalized atrophy and chronic small vessel ischemia. No subdural or extra-axial collection. No evidence of acute ischemia. No midline shift. Vascular: No hyperdense vessel. Skull: No skull fracture. Sinuses/Orbits: Total opacification of right mastoid air cells, unchanged from prior exam. Subtotal opacification of lower left mastoid air cells, also unchanged. Bilateral cataract resection. Other: None. IMPRESSION: 1. No acute intracranial abnormality. 2. Stable atrophy and chronic small vessel ischemia. 3. Heterogeneous density in the sella turcica with sellar expansion, not significantly changed from prior head CT. Patient with reported history of prior pituitary  surgery. This is incompletely assessed by CT. As clinically indicated, recommend further assessment with pituitary protocol MRI. 4. Bilateral mastoid effusions, unchanged from prior. Electronically Signed   By: Keith Rake M.D.   On: 12/24/2020 18:53   DG Chest Portable 1 View  Result Date: 01/02/2021 CLINICAL DATA:  Mental status change EXAM: PORTABLE CHEST 1 VIEW COMPARISON:  06/07/2020 FINDINGS: Low lung volumes. Interim development of patchy airspace disease and consolidation in the left thorax. Cardiomegaly with aortic  atherosclerosis. IMPRESSION: 1. Interim development of airspace disease and consolidation in left thorax concerning for pneumonia 2. Cardiomegaly Electronically Signed   By: Donavan Foil M.D.   On: 12/23/2020 16:23   US Abdomen Limited RUQ (LIVER/GB)  Result Date: 12/18/2020 CLINICAL DATA:  Elevated LFTs EXAM: ULTRASOUND ABDOMEN LIMITED RIGHT UPPER QUADRANT COMPARISON:  None. FINDINGS: Gallbladder: No shadowing stone. Upper normal gallbladder wall thickness. Negative sonographic Murphy. Common bile duct: Diameter: 3 mm Liver: No focal lesion identified. Within normal limits in parenchymal echogenicity. Portal vein is patent on color Doppler imaging with normal direction of blood flow towards the liver. Other: Right kidney appears slightly echogenic. IMPRESSION: 1. Negative for gallstones or biliary dilatation. 2. Right kidney appears slightly echogenic consistent with medical renal disease Electronically Signed   By: Donavan Foil M.D.   On: 12/22/2020 21:09        Scheduled Meds:  sodium chloride   Intravenous Once   brimonidine  1 drop Both Eyes q morning   insulin aspart  5 Units Intravenous Once   And   dextrose  1 ampule Intravenous Once   hydrocortisone sod succinate (SOLU-CORTEF) inj  100 mg Intravenous Q8H   insulin aspart  0-9 Units Subcutaneous Q4H   latanoprost  1 drop Both Eyes QHS   levothyroxine  50 mcg Oral QAC breakfast   Continuous Infusions:  azithromycin Stopped (12/20/2020 1928)   cefTRIAXone (ROCEPHIN)  IV       LOS: 1 day   Time spent: 65mins Greater than 50% of this time was spent in counseling, explanation of diagnosis, planning of further management, and coordination of care.   Voice Recognition Viviann Spare dictation system was used to create this note, attempts have been made to correct errors. Please contact the author with questions and/or clarifications.   Florencia Reasons, MD PhD FACP [facility]  Available via Epic secure chat 7am-7pm for nonurgent  issues Please page for urgent issues To page the attending provider between 7A-7P or the covering provider during after hours 7P-7A, please log into the web site [email or web address] and access using universal [place] password for that web site. If you do not have the password, please call the hospital operator.    12/22/2020, 10:26 AM

## 2020-12-22 NOTE — Consult Note (Addendum)
Attending physician's note   I have taken an interval history, reviewed the chart and examined the patient. I agree with the Advanced Practitioner's note, impression, and recommendations as outlined.   80 year old female with a history of dementia (baseline disoriented to person, place, time, situation), HLD, HTN, CKD 4, CHF, thyroid disease, presents with acute on chronic mental status change, fatigue, weakness.  Patient's son is in the room during my exam and provides history.  Patient otherwise altered and not communicative.  Reports fatigue and progressive mental status change over the last week or so.  Was diagnosed with CAP in the ER and started on ceftriaxone and azithromycin.  GI service was consulted for elevated liver enzymes.  06/07/2020: - AST/ALT/ALP/T bili: 35/24/50/0 0.2  12/23/2020: - AST/ALT/ALP/T bili: 471/263/130/1.1  12/22/2020: - AST/ALT/ALP/T bili: 327/234/115/0.4 --> 218/219/131/0 0.4  Additional admission evaluation notable for the following: - WBC 9.2 - H/H 7.4/25.6.  Transfused 2 unit PRBCs with posttransfusion H/H 9.2/32.2 - Lactate 2 --> 2.5 - Normal CK, prealbumin, ammonia - Negative/normal acute viral hepatitis panel, APAP - Elevated PCT (1.07), troponin (98), BNP (913) - Ferritin 53, iron 19, TIBC 269, sat 7% - Normal B12/folate - RUQ [place]: Normal liver, CBD - CT head: No acute intracranial pathology - CXR: LUL pneumonia  1) Elevated liver enzymes 2) CAP 3) Altered sensorium 4) Dementia - Liver enzymes downtrending and RUQ [place] was otherwise largely unremarkable, to include no radiographic or serologic evidence of obstruction - Suspect elevated enzymes 2/2 sepsis with plan to treat underlying etiology with antibiotics - Can repeat liver enzymes again in the morning.  If still downtrending, no need for further hepatic work-up - Would not be surprised to see an eventual, slight uptrend in T bili as a delayed cholestasis of sepsis picture - No plan for ERCP -  Continue antibiotics per primary Hospitalist service  5) AKI on CKD - Treatment per primary Medicine service  6) Sinus bradycardia 7) Elevated troponin 8) CHF, elevated BNP - Cardiology service consulted  9) Acute on chronic anemia - Responsive to 2U PRBC transfusion - Likely secondary to above CKD - No reported overt GI blood loss - Based on age, significant comorbidities, AMS, etc., do not feel that bowel preparation for EGD/colonoscopy for diagnostic intent is appropriate at this time.  Discussed with her son at bedside who agrees - Continue serial H/H checks with additional blood products as needed per protocol  Allison Heck, DO, FACG [phone] office           Referring Provider:  [facility]         Primary Care Physician:  Allison Nova, MD Primary Gastroenterologist:  Allison Wang  Reason for Consultation:   elevated liver chemistries                ASSESSMENT / PLAN   # 80 yo female with elevated liver enzymes. Liver tests normal in January 2022.  Acetaminophen level negative. Acute viral hepatitis panel negative. Abdominal US negative. Could be drug induced liver injury vrs sepsis but query congestive hepatopathy or shock liver.  --Liver enzymes already already down trending.  --Avoid heptoxic medications for now.  --Am LFTs.   # Anemia, likely related to CKD (Creatinine 4.2, GFR 10). Initial hgb 8.8, a few minutes later repeat hgb was 7.4. She received a u PRBC with improvement in hgb to 9.2 today.  No overt GI bleeding  # CAP / bacteremia. 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 Also on thyroid replacement   HISTORY OF PRESENT ILLNESS                                                                                                                         Chief Complaint: none from patient  Allison Wang is a 80 y.o. female with a past medical history significant for dementia, HTN,  hyperlipidemia, thyroid disease, hypopituitarism, DM, CKD  Patient presented to ED yesterday with altered mental status. Son in room and tells me patient has dementia but is normally able to talk. About one week ago she stopped talking and eating and has been sleeping a lot. Patient lives with her daughter who is a Pharmacist, hospital. During the day patient often goes to a daycare. Son not aware if mother recently started any new medications. He doesn't think she has any prior history of liver disease. She has exhibited any signs of being in pain which is usually through grimacing.   In ED a head CT scan was negative. CXR suggests PNA. Ammonia normal. Her WBC was normal, hgb 8.8 but on recheck a few minutes later it was 7.4.  MCV 87, platelets 308. INR 1.0. TSH normal. AST 471 / ALT 263. Normal Tbili. Alk phos 130. In ED shad an episode of bradycardia with HR in the 30's. Cardiology to see. This am her VSS.     SIGNIFICANT DIAGNOTIC STUDIES    Abd US IMPRESSION: 1. Negative for gallstones or biliary dilatation. 2. Right kidney appears slightly echogenic consistent with medical renal disease  PREVIOUS ENDOSCOPIC EVALUATIONS   none   Past Medical History:  Diagnosis Date  . Chronic kidney disease (CKD) stage G4/A2, severely decreased glomerular filtration rate (GFR) between 15-29 mL/min/1.73 square meter and albuminuria creatinine ratio between 30-299 mg/g (HCC)   . Dementia ([place])   . Hyperlipemia   . Hypertension   . Thyroid disease     Past Surgical History:  Procedure Laterality Date  . BREAST SURGERY    . PITUITARY SURGERY      Prior to Admission medications   Medication Sig Start Date End Date Taking? Authorizing Provider  acetaminophen (TYLENOL) 650 MG CR tablet Take 650-1,300 mg by mouth every 8 (eight) hours as needed for pain.   Yes [provider]  allopurinol (ZYLOPRIM) 100 MG tablet Take 100 mg by mouth 2 (two) times daily. 06/18/20  Yes [provider]   amLODipine (NORVASC) 5 MG tablet Take 5 mg by mouth every morning. 12/08/20  Yes [provider]  ASHWAGANDHA PO Take 1 capsule by mouth every evening.   Yes [provider]  atorvastatin (LIPITOR) 40 MG tablet Take 40 mg by mouth at bedtime. 06/18/20  Yes [provider]  bimatoprost (LUMIGAN) 0.01 % SOLN Place 1 drop into both eyes at bedtime.   Yes [provider]  brimonidine (ALPHAGAN) 0.2 % ophthalmic solution Place 1 drop into both eyes every morning.   Yes [provider]  COCONUT OIL PO Take 15 mLs by mouth daily.   Yes [provider]  donepezil (ARICEPT) 23 MG TABS tablet Take 23 mg by mouth every evening. 12/04/20  Yes [provider]  folic acid (FOLVITE) 1 MG tablet Take 1 mg by mouth every morning. 06/18/20  Yes [provider]  furosemide (LASIX) 20 MG tablet Take 20 mg by mouth every other day. 06/18/20  Yes [provider]  hydrALAZINE (APRESOLINE) 50 MG tablet Take 50 mg by mouth 3 (three) times daily. 06/04/20  Yes [provider]  hydrocortisone (CORTEF) 5 MG tablet Take 5-15 mg by mouth See admin instructions. Take 3 tablets (15 mg) by mouth every morning and take 1 tablet (5 mg) after 1pm 06/18/20  Yes [provider]  ketotifen (ZADITOR) 0.025 % ophthalmic solution Place 1 drop into both eyes 2 (two) times daily as needed (allergies).   Yes [provider]  levothyroxine (SYNTHROID) 50 MCG tablet Take 50 mcg by mouth daily before breakfast. 06/18/20  Yes [provider]  OVER THE COUNTER MEDICATION Take 5 mLs by mouth daily. Dandelion Root   Yes [provider]  OVER THE COUNTER MEDICATION Take 5 mLs by mouth every evening. Burdock Root   Yes [provider]  OVER THE COUNTER MEDICATION Take 1 capsule by mouth every morning. Alpha brain   Yes [provider]  polyethylene glycol (MIRALAX / GLYCOLAX) 17 g packet Take 17 g by mouth daily as needed  (constipation).   Yes [provider]  repaglinide (PRANDIN) 0.5 MG tablet Take 0.5 mg by mouth 2 (two) times daily before a meal. 06/04/20  Yes [provider]    Current Facility-Administered Medications  Medication Dose Route Frequency Provider Last Rate Last Admin  . 0.9 %  sodium chloride infusion (Manually program via Guardrails IV Fluids)   Intravenous Once Toy Baker, MD   Held at 12/16/2020 2212  . albuterol (PROVENTIL) (2.5 MG/3ML) 0.083% nebulizer solution 2.5 mg  2.5 mg Nebulization Q2H PRN Doutova, Anastassia, MD      . azithromycin (ZITHROMAX) 500 mg in sodium chloride 0.9 % 250 mL IVPB  500 mg Intravenous Q24H Toy Baker, MD   Stopped at 12/23/2020 1928  . brimonidine (ALPHAGAN) 0.2 % ophthalmic solution 1 drop  1 drop Both Eyes q morning Doutova, Anastassia, MD      . cefTRIAXone (ROCEPHIN) 2 g in sodium chloride 0.9 % 100 mL IVPB  2 g Intravenous Q24H Doutova, Anastassia, MD      . insulin aspart (novoLOG) injection 5 Units  5 Units Intravenous Once Doutova, Anastassia, MD       And  . dextrose 50 % solution 50 mL  1 ampule Intravenous Once Doutova, Anastassia, MD      . hydrocortisone sodium succinate (SOLU-CORTEF) 100 MG injection 100 mg  100 mg Intravenous Q8H Doutova, Anastassia, MD   100 mg at 12/22/20 0417  . insulin aspart (novoLOG) injection 0-9 Units  0-9 Units Subcutaneous Q4H Toy Baker, MD   1 Units at 12/22/20 0815  . latanoprost (XALATAN) 0.005 % ophthalmic solution 1 drop  1 drop Both Eyes QHS Doutova, Anastassia, MD   1 drop at 12/20/2020 2348  . levothyroxine (SYNTHROID) tablet 50 mcg  50 mcg Oral QAC breakfast Doutova, Anastassia, MD      . polyethylene glycol (MIRALAX / GLYCOLAX) packet 17 g  17 g Oral Daily PRN Toy Baker, MD       Current Outpatient Medications  Medication  Sig Dispense Refill  . acetaminophen (TYLENOL) 650 MG CR tablet Take 650-1,300 mg by mouth every 8 (eight) hours as needed for pain.    Marland Kitchen  allopurinol (ZYLOPRIM) 100 MG tablet Take 100 mg by mouth 2 (two) times daily.    Marland Kitchen amLODipine (NORVASC) 5 MG tablet Take 5 mg by mouth every morning.    . ASHWAGANDHA PO Take 1 capsule by mouth every evening.    Marland Kitchen atorvastatin (LIPITOR) 40 MG tablet Take 40 mg by mouth at bedtime.    . bimatoprost (LUMIGAN) 0.01 % SOLN Place 1 drop into both eyes at bedtime.    . brimonidine (ALPHAGAN) 0.2 % ophthalmic solution Place 1 drop into both eyes every morning.    Marland Kitchen COCONUT OIL PO Take 15 mLs by mouth daily.    Marland Kitchen donepezil (ARICEPT) 23 MG TABS tablet Take 23 mg by mouth every evening.    . folic acid (FOLVITE) 1 MG tablet Take 1 mg by mouth every morning.    . furosemide (LASIX) 20 MG tablet Take 20 mg by mouth every other day.    . hydrALAZINE (APRESOLINE) 50 MG tablet Take 50 mg by mouth 3 (three) times daily.    . hydrocortisone (CORTEF) 5 MG tablet Take 5-15 mg by mouth See admin instructions. Take 3 tablets (15 mg) by mouth every morning and take 1 tablet (5 mg) after 1pm    . ketotifen (ZADITOR) 0.025 % ophthalmic solution Place 1 drop into both eyes 2 (two) times daily as needed (allergies).    Marland Kitchen levothyroxine (SYNTHROID) 50 MCG tablet Take 50 mcg by mouth daily before breakfast.    . OVER THE COUNTER MEDICATION Take 5 mLs by mouth daily. Dandelion Root    . OVER THE COUNTER MEDICATION Take 5 mLs by mouth every evening. Burdock Root    . OVER THE COUNTER MEDICATION Take 1 capsule by mouth every morning. Alpha brain    . polyethylene glycol (MIRALAX / GLYCOLAX) 17 g packet Take 17 g by mouth daily as needed (constipation).    . repaglinide (PRANDIN) 0.5 MG tablet Take 0.5 mg by mouth 2 (two) times daily before a meal.      Allergies as of 01/01/2021 - Review Complete 12/23/2020  Allergen Reaction Noted  . Lisinopril Swelling 12/17/2020    Family History  Problem Relation Age of Onset  . Hypertension Other     Social History   Socioeconomic History  . Marital status: Single    Spouse  name: Not on file  . Number of children: Not on file  . Years of education: Not on file  . Highest education level: Not on file  Occupational History  . Not on file  Tobacco Use  . Smoking status: Never  . Smokeless tobacco: Never  Substance and Sexual Activity  . Alcohol use: Not Currently  . Drug use: Never  . Sexual activity: Not on file  Other Topics Concern  . Not on file  Social History Narrative  . Not on file   Social Determinants of Health   Financial Resource Strain: Not on file  Food Insecurity: Not on file  Transportation Needs: Not on file  Physical Activity: Not on file  Stress: Not on file  Social Connections: Not on file  Intimate Partner Violence: Not on file    Review of Systems: Unable to obtain secondary to altered mental status   OBJECTIVE    Physical Exam: Vital signs in last 24 hours: Temp:  [97.6 F (  36.4 C)-98.5 F (36.9 C)] 98.5 F (36.9 C) (08/09 0645) Pulse Rate:  [38-75] 59 (08/09 1030) Resp:  [12-23] 14 (08/09 1030) BP: (87-168)/(48-120) 135/60 (08/09 1030) SpO2:  [91 %-100 %] 97 % (08/09 1030) Weight:  [68.9 kg] 68.9 kg (08/08 1555)   General:   Sleepy  female in NAD Eyes:  Pupils equal, sclera clear, no icterus.   Conjunctiva pale. Ears:  Normal auditory acuity. Nose:  No deformity, discharge,  or lesions. Neck:  Supple; no masses Lungs:  No wheezes / crackles in chest.  Heart:  Regular rate and rhythm;  no lower extremity edema Abdomen:  Soft, non-distended, nontender, BS active, no palp mass   Rectal:  Deferred  Msk:  LUE weakness. Neurologic:  sleepy but tries to wake up when prompted. Tries to follow commands. Will squeeze with right hand but not left Skin:  Intact without significant lesions or rashes.   Scheduled inpatient medications . sodium chloride   Intravenous Once  . brimonidine  1 drop Both Eyes q morning  . insulin aspart  5 Units Intravenous Once   And  . dextrose  1 ampule Intravenous Once  .  hydrocortisone sod succinate (SOLU-CORTEF) inj  100 mg Intravenous Q8H  . insulin aspart  0-9 Units Subcutaneous Q4H  . latanoprost  1 drop Both Eyes QHS  . levothyroxine  50 mcg Oral QAC breakfast      Intake/Output from previous day: 08/08 0701 - 08/09 0700 In: 665 [Blood:315; IV Piggyback:350] Out: -  Intake/Output this shift: Total I/O In: 442.7 [I.V.:442.7] Out: -    Lab Results: Recent Labs    01/12/2021 2046 12/29/2020 2050 12/22/20 0800  WBC  --  9.2 9.6  HGB 8.8* 7.4* 9.2*  HCT 26.0* 25.6* 32.2*  PLT  --  308 330   BMET Recent Labs    01/06/2021 1700 12/16/2020 2046 01/06/2021 2049 12/22/20 0800  NA 141 145 142 145  K 6.1* 4.2 4.1 4.3  CL 106  --  108 109  CO2 23  --  24 24  GLUCOSE 180*  --  131* 135*  BUN 62*  --  63* 65*  CREATININE 4.13*  --  4.16* 4.26*  CALCIUM 9.9  --  9.8 10.0   LFT Recent Labs    12/22/20 0800  PROT 6.7  ALBUMIN 3.4*  AST 218*  ALT 219*  ALKPHOS 131*  BILITOT 0.7   PT/INR Recent Labs    12/20/2020 2049 12/22/20 0800  LABPROT 13.3 13.3  INR 1.0 1.0   Hepatitis Panel Recent Labs    12/18/2020 2049  HEPBSAG NON REACTIVE  HCVAB NON REACTIVE  HEPAIGM NON REACTIVE  HEPBIGM NON REACTIVE     . CBC Latest Ref Rng & Units 12/22/2020 01/13/2021 12/29/2020  WBC 4.0 - 10.5 K/uL 9.6 9.2 -  Hemoglobin 12.0 - 15.0 g/dL 9.2(L) 7.4(L) 8.8(L)  Hematocrit 36.0 - 46.0 % 32.2(L) 25.6(L) 26.0(L)  Platelets 150 - 400 K/uL 330 308 -    . CMP Latest Ref Rng & Units 12/22/2020 01/07/2021 01/12/2021  Glucose 70 - 99 mg/dL 135(H) 131(H) -  BUN 8 - 23 mg/dL 65(H) 63(H) -  Creatinine 0.44 - 1.00 mg/dL 4.26(H) 4.16(H) -  Sodium 135 - 145 mmol/L 145 142 145  Potassium 3.5 - 5.1 mmol/L 4.3 4.1 4.2  Chloride 98 - 111 mmol/L 109 108 -  CO2 22 - 32 mmol/L 24 24 -  Calcium 8.9 - 10.3 mg/dL 10.0 9.8 -  Total Protein 6.5 -  8.1 g/dL 6.7 6.0(L) -  Total Bilirubin 0.3 - 1.2 mg/dL 0.7 0.4 -  Alkaline Phos 38 - 126 U/L 131(H) 115 -  AST 15 - 41 U/L 218(H)  327(H) -  ALT 0 - 44 U/L 219(H) 234(H) -   Studies/Results: CT HEAD WO CONTRAST (5MM)  Result Date: 12/17/2020 CLINICAL DATA:  Mental status change, unknown cause mental status change EXAM: CT HEAD WITHOUT CONTRAST TECHNIQUE: Contiguous axial images were obtained from the base of the skull through the vertex without intravenous contrast. COMPARISON:  Head CT 06/07/2020. FINDINGS: Brain: Heterogeneous density in the sella turcica of with sellar expansion, not significantly changed from prior head CT. No acute intracranial hemorrhage. Stable generalized atrophy and chronic small vessel ischemia. No subdural or extra-axial collection. No evidence of acute ischemia. No midline shift. Vascular: No hyperdense vessel. Skull: No skull fracture. Sinuses/Orbits: Total opacification of right mastoid air cells, unchanged from prior exam. Subtotal opacification of lower left mastoid air cells, also unchanged. Bilateral cataract resection. Other: None. IMPRESSION: 1. No acute intracranial abnormality. 2. Stable atrophy and chronic small vessel ischemia. 3. Heterogeneous density in the sella turcica with sellar expansion, not significantly changed from prior head CT. Patient with reported history of prior pituitary surgery. This is incompletely assessed by CT. As clinically indicated, recommend further assessment with pituitary protocol MRI. 4. Bilateral mastoid effusions, unchanged from prior. Electronically Signed   By: Keith Rake M.D.   On: 01/09/2021 18:53   DG Chest Portable 1 View  Result Date: 12/17/2020 CLINICAL DATA:  Mental status change EXAM: PORTABLE CHEST 1 VIEW COMPARISON:  06/07/2020 FINDINGS: Low lung volumes. Interim development of patchy airspace disease and consolidation in the left thorax. Cardiomegaly with aortic atherosclerosis. IMPRESSION: 1. Interim development of airspace disease and consolidation in left thorax concerning for pneumonia 2. Cardiomegaly Electronically Signed   By: Donavan Foil M.D.   On: 12/16/2020 16:23   US Abdomen Limited RUQ (LIVER/GB)  Result Date: 01/08/2021 CLINICAL DATA:  Elevated LFTs EXAM: ULTRASOUND ABDOMEN LIMITED RIGHT UPPER QUADRANT COMPARISON:  None. FINDINGS: Gallbladder: No shadowing stone. Upper normal gallbladder wall thickness. Negative sonographic Murphy. Common bile duct: Diameter: 3 mm Liver: No focal lesion identified. Within normal limits in parenchymal echogenicity. Portal vein is patent on color Doppler imaging with normal direction of blood flow towards the liver. Other: Right kidney appears slightly echogenic. IMPRESSION: 1. Negative for gallstones or biliary dilatation. 2. Right kidney appears slightly echogenic consistent with medical renal disease Electronically Signed   By: Donavan Foil M.D.   On: 12/20/2020 21:09    Active Problems:   CAP (community acquired pneumonia)   Bradycardia   Acute metabolic encephalopathy   DM2 (diabetes mellitus, type 2) ([place])   CKD (chronic kidney disease), stage V (HCC)   Dementia (HCC)   Essential hypertension   Chronic diastolic CHF (congestive heart failure) (HCC)   Acute on chronic renal failure (HCC)   Elevated LFTs   Hyperkalemia   Acute respiratory failure with hypoxia (HCC)   Anemia due to chronic kidney disease   Symptomatic anemia    Tye Savoy, NP-C @  12/22/2020, 11:10 AM

## 2020-12-22 NOTE — ED Notes (Signed)
Breakfast Orders Placed °

## 2020-12-22 NOTE — Progress Notes (Signed)
SLP Cancellation Note  Patient Details Name: Allison Wang. Mian MRN: [id number] DOB: 1941-03-14   Cancelled treatment:        Attempted to see pt for clinical swallowing evaluation.  Pt was asleep on SLP arrival and was extremely difficult to rouse.  Pt was only briefly responsive for a second or two at a time and could not maintain wakefulness to participate in PO trials.  SLP will reattempt as schedule permits, when pt is awake/alert.    Celedonio Savage, MA, [place] Office: [phone] 12/22/2020, 11:23 AM

## 2020-12-22 NOTE — Consult Note (Signed)
[name] Renal Consultation Note  Requesting MD: Allison Reasons, MD Indication for Consultation:  AKI on CKD  Chief complaint: ams  HPI:  Allison Wang is a 80 y.o. female with a history of CKD stage V, dementia, and HTN who presented to [facility] with confusion.  Family requested nephrology consultation.  CXR in the ER had concern for PNA.  She required 3 liters of oxygen in ER and has been on 5 liters as of this afternoon on floor.  Her daughter states that for about the past year the patient has not always been oriented to self and has not always been able to recognize her children or family.  She is usually able to ambulate with a walker. They reported being told that she was not a candidate for dialysis in the past.  She has seen Dr. Joylene Wang at [facility] on 11/26/20.  He charted severe dementia and that she would not be a candidate for dialysis.  Cr 2.83, eGFR 16, BUN 42 on 11/26/20.  During our exam she was agitated at one point and pulled at her teeth as if attempting to take them out.  Her daughter took out her lower dentures and we got a box for those from nursing.  Her nurse was able to take out the top set of dentures.  Her daughter states that the patient is not normally this sleepy.  She is not sure of her mother's lasix dose.  Creatinine, Ser  Date/Time Value Ref Range Status  12/22/2020 08:00 AM 4.26 (H) 0.44 - 1.00 mg/dL Final  01/01/2021 08:49 PM 4.16 (H) 0.44 - 1.00 mg/dL Final  01/07/2021 05:00 PM 4.13 (H) 0.44 - 1.00 mg/dL Final  06/07/2020 05:13 PM 3.15 (H) 0.44 - 1.00 mg/dL Final     PMHx:   Past Medical History:  Diagnosis Date   Chronic kidney disease (CKD) stage G4/A2, severely decreased glomerular filtration rate (GFR) between 15-29 mL/min/1.73 square meter and albuminuria creatinine ratio between 30-299 mg/g (HCC)    Dementia (HCC)    Hyperlipemia    Hypertension    Thyroid disease     Past Surgical History:  Procedure Laterality Date   BREAST  SURGERY     PITUITARY SURGERY      Family Hx:  Family History  Problem Relation Age of Onset   Hypertension Other     Social History:  reports that she has never smoked. She has never used smokeless tobacco. She reports previous alcohol use. She reports that she does not use drugs.  Allergies:  Allergies  Allergen Reactions   Lisinopril Swelling    Facial/lip swelling     Medications: Prior to Admission medications   Medication Sig Start Date End Date Taking? Authorizing Provider  acetaminophen (TYLENOL) 650 MG CR tablet Take 650-1,300 mg by mouth every 8 (eight) hours as needed for pain.   Yes [provider]  allopurinol (ZYLOPRIM) 100 MG tablet Take 100 mg by mouth 2 (two) times daily. 06/18/20  Yes [provider]  amLODipine (NORVASC) 5 MG tablet Take 5 mg by mouth every morning. 12/08/20  Yes [provider]  ASHWAGANDHA PO Take 1 capsule by mouth every evening.   Yes [provider]  atorvastatin (LIPITOR) 40 MG tablet Take 40 mg by mouth at bedtime. 06/18/20  Yes [provider]  bimatoprost (LUMIGAN) 0.01 % SOLN Place 1 drop into both eyes at bedtime.   Yes [provider]  brimonidine (ALPHAGAN) 0.2 % ophthalmic solution Place  1 drop into both eyes every morning.   Yes [provider]  COCONUT OIL PO Take 15 mLs by mouth daily.   Yes [provider]  donepezil (ARICEPT) 23 MG TABS tablet Take 23 mg by mouth every evening. 12/04/20  Yes [provider]  folic acid (FOLVITE) 1 MG tablet Take 1 mg by mouth every morning. 06/18/20  Yes [provider]  furosemide (LASIX) 20 MG tablet Take 20 mg by mouth every other day. 06/18/20  Yes [provider]  hydrALAZINE (APRESOLINE) 50 MG tablet Take 50 mg by mouth 3 (three) times daily. 06/04/20  Yes [provider]  hydrocortisone (CORTEF) 5 MG tablet Take 5-15 mg by mouth See admin instructions. Take 3 tablets (15 mg) by mouth every  morning and take 1 tablet (5 mg) after 1pm 06/18/20  Yes [provider]  ketotifen (ZADITOR) 0.025 % ophthalmic solution Place 1 drop into both eyes 2 (two) times daily as needed (allergies).   Yes [provider]  levothyroxine (SYNTHROID) 50 MCG tablet Take 50 mcg by mouth daily before breakfast. 06/18/20  Yes [provider]  OVER THE COUNTER MEDICATION Take 5 mLs by mouth daily. Dandelion Root   Yes [provider]  OVER THE COUNTER MEDICATION Take 5 mLs by mouth every evening. Burdock Root   Yes [provider]  OVER THE COUNTER MEDICATION Take 1 capsule by mouth every morning. Alpha brain   Yes [provider]  polyethylene glycol (MIRALAX / GLYCOLAX) 17 g packet Take 17 g by mouth daily as needed (constipation).   Yes [provider]  repaglinide (PRANDIN) 0.5 MG tablet Take 0.5 mg by mouth 2 (two) times daily before a meal. 06/04/20  Yes [provider]    I have reviewed the patient's current and reported prior to admission medications.  Labs:  BMP Latest Ref Rng & Units 12/22/2020 01/13/2021 12/24/2020  Glucose 70 - 99 mg/dL 135(H) 131(H) -  BUN 8 - 23 mg/dL 65(H) 63(H) -  Creatinine 0.44 - 1.00 mg/dL 4.26(H) 4.16(H) -  Sodium 135 - 145 mmol/L 145 142 145  Potassium 3.5 - 5.1 mmol/L 4.3 4.1 4.2  Chloride 98 - 111 mmol/L 109 108 -  CO2 22 - 32 mmol/L 24 24 -  Calcium 8.9 - 10.3 mg/dL 10.0 9.8 -    Urinalysis    Component Value Date/Time   COLORURINE YELLOW 12/30/2020 2146   APPEARANCEUR HAZY (A) 12/20/2020 2146   LABSPEC 1.016 12/14/2020 2146   PHURINE 5.0 01/01/2021 2146   GLUCOSEU NEGATIVE 12/23/2020 2146   HGBUR NEGATIVE 01/12/2021 2146   [place] NEGATIVE 12/31/2020 2146   [place] NEGATIVE 12/23/2020 2146   PROTEINUR 30 (A) 01/09/2021 2146   NITRITE NEGATIVE 12/24/2020 2146   LEUKOCYTESUR NEGATIVE 12/20/2020 2146     ROS: Unable to obtain secondary to severe dementia  Physical Exam: Vitals:    12/22/20 1445 12/22/20 1500  BP: (!) 120/54 128/60  Pulse: (!) 54 (!) 55  Resp: 15 13  Temp:    SpO2: 100% 100%     General: Elderly female in bed minimally interactive with exam HEENT: Normocephalic atraumatic Eyes: Eyes most often closed.  When opened briefly.  Sclera appear anicteric Neck: Supple trachea midline Heart: S1-S2 no rub Lungs: Clear to auscultation anteriorly on 5 L of oxygen nasal cannula Abdomen: Soft nontender nondistended Extremities: Trace edema lower extremities Skin: No rash on extremities exposed Neuro: She is nonverbal on exam today.  She does open her  eyes to the phrase "[name]" when her daughter speaks  Assessment/Plan:  # AKI  - Ischemic and pre-renal insults in the setting of infection/PNA - with hyperkalemia which has resolved - She is NPO and if this is listed would need a low potassium diet  # CKD stage V - She is not always oriented to person and sometimes does not recognize family.  Nephrology has previously characterized her as a poor dialysis candidate given her dementia.  I do agree that she is a not a candidate for dialysis and her daughter and I thoughtfully discussed this  # Acute respiratory failure with hypoxia - Secondary to PNA as well as some component of pulm edema  - She is comfortable on exam right now - Can give lasix if needed for respiratory distress (I.e. lasix 60 mg IV once)  # PNA - Abx per primary team   # Dementia  - at baseline not always oriented to person for the past year   # AMS - family reports she is not normally this sleepy  # HTN  - controlled  # Transaminitis  - indicative of possible ischemic insult; improved slightly    # Normocytic anemia  - no acute indication for PRBC's   Claudia Desanctis 12/22/2020, 4:58 PM

## 2020-12-22 NOTE — ED Notes (Signed)
Report given Marcille Buffy, RN of [phone]

## 2020-12-22 NOTE — ED Notes (Signed)
CBG - 56

## 2020-12-22 NOTE — ED Notes (Signed)
RT obtained labs via ART stick. Dr. Roel Cluck aware that's what we had to do.

## 2020-12-22 NOTE — Consult Note (Addendum)
Cardiology Consultation:   Patient ID: Allison Wang. Allison Wang MRN: [id number]; DOB: Nov 26, 1940  Admit date: 12/14/2020 Date of Consult: 12/22/2020  PCP:  Roselee Nova, MD   [facility] Cardiologist:  New to Dr. Acie Fredrickson    Patient Profile:   Allison Wang. Allison Wang is a 80 y.o. female with a hx of CKD stage V, rheumatoid arthritis, hypertension, hypothyroidism, hyperlipidemia, gout, dementia, pituitary disease, who is currently admitted for acute metabolic encephalopathy and pneumonia, cardiology is consulted for bradycardia on  12/22/2020 at the request of Dr. Roel Cluck.   History of Present Illness:   [name] does not follow cardiology outpatient and has no known cardiac disease.   There is limited medical records available and patient is not able to provide reliable history due to underlying dementia.  Patient's daughter sent patient to the ER on 12/23/2020 evening due to change of mental status.  Patient was reportedly not acting like herself.  She was felt much weaker and lethargic than her baseline.  She was found to be bradycardic with normal blood pressure and hypoxic requiring 3 L nasal cannula oxygen at ED.   Per admission notes reports, patient was recently moved from [facility] to [place].  Patient has baseline dementia and does not recognize family members at baseline.  She has known pituitary disease and is hydrocortisone, has a pending MRI exam for evaluation.  She has chronic kidney disease stage V with baseline creatinine reportedly around 3.  She has an echo completed on 11/24/2020 revealing mild concentric LVH, EF 60 to 65%, no regional wall motion abnormality, grade 1 DD, mildly dilated LA, small pericardial effusion, mild to moderate MR and trivial AR.  Admission diagnostic revealed hypokalemia 6.1, glucose 180, BUN 62, creatinine 4.13, GFR 10.  Transaminitis with alk phos 130, AST 471, ALT 263.  Albumin 3.1.  CK WNL.  Lactic acid elevated  2-2.5-1.7. CBC revealed normocytic anemia with hemoglobin 8.8 / 7.4.  FOBT positive.  Urinalysis unremarkable for infection.  UDS negative for illicit drugs.  Abdominal ultrasound revealed no acute finding.  CT brain revealed stable atrophy and chronic small vessel ischemia, no acute abnormality.  Chest x-ray revealed left thorax consolidation concerning for pneumonia. Flu and COVID-negative.  TSH WNL.  Hemoglobin A1c 5.8%.  Acute hepatitis panel negative.  Procalcitonin elevated 1.07.  BNP elevated 913.  High sensitive troponin 98 >105 >108.  EKG at admission showed sinus bradycardia at 50s, non-specific IVCD.  Patient subsequently admitted to hospital medicine for community-acquired pneumonia, 1/2 blood culture shortly returned positive for staph epidermis. Additional work-up so far revealed negative MRSA, strep pneumoniae.  Mag elevated 2.5. Phos elevated 5.6. Ammonia WNL.  Hyperkalemia has resolved.  Vitamin B12 WNL.  INR 1.  She was started on the antibiotic, cardiology consult is requested for bradycardia.   During encounter,  she is sleepy, opened eyes to loud voice, answered to her name. She is not able to answer questions and nodded vertically when asked if she has any chest pain.  She was able to follow one-step commands.  Patient's son is at bedside to assist history.  Son states patient lives with her daughter, was noted with 1 week onset of weakness and lethargy, not acting like her usual self.  She has dementia, is able to walk with a walker, able to eat, and able to use bathroom with assistance.  She has been taking hydrocortisone for 68-month son felt patient is getting more fluid in her legs.  Son reports  that her previous kidney doctor told family that she is too weak for dialysis.  Patient has not complained of any chest pain or shortness of breath at home. Son states patient has never been diagnosed with CAD, MI or required stent or CABG in the past.     Past Medical History:  Diagnosis  Date   Chronic kidney disease (CKD) stage G4/A2, severely decreased glomerular filtration rate (GFR) between 15-29 mL/min/1.73 square meter and albuminuria creatinine ratio between 30-299 mg/g (HCC)    Dementia (HCC)    Hyperlipemia    Hypertension    Thyroid disease     Past Surgical History:  Procedure Laterality Date   BREAST SURGERY     PITUITARY SURGERY       Home Medications:  Prior to Admission medications   Medication Sig Start Date End Date Taking? Authorizing Provider  acetaminophen (TYLENOL) 650 MG CR tablet Take 650-1,300 mg by mouth every 8 (eight) hours as needed for pain.   Yes [provider]  allopurinol (ZYLOPRIM) 100 MG tablet Take 100 mg by mouth 2 (two) times daily. 06/18/20  Yes [provider]  amLODipine (NORVASC) 5 MG tablet Take 5 mg by mouth every morning. 12/08/20  Yes [provider]  ASHWAGANDHA PO Take 1 capsule by mouth every evening.   Yes [provider]  atorvastatin (LIPITOR) 40 MG tablet Take 40 mg by mouth at bedtime. 06/18/20  Yes [provider]  bimatoprost (LUMIGAN) 0.01 % SOLN Place 1 drop into both eyes at bedtime.   Yes [provider]  brimonidine (ALPHAGAN) 0.2 % ophthalmic solution Place 1 drop into both eyes every morning.   Yes [provider]  COCONUT OIL PO Take 15 mLs by mouth daily.   Yes [provider]  donepezil (ARICEPT) 23 MG TABS tablet Take 23 mg by mouth every evening. 12/04/20  Yes [provider]  folic acid (FOLVITE) 1 MG tablet Take 1 mg by mouth every morning. 06/18/20  Yes [provider]  furosemide (LASIX) 20 MG tablet Take 20 mg by mouth every other day. 06/18/20  Yes [provider]  hydrALAZINE (APRESOLINE) 50 MG tablet Take 50 mg by mouth 3 (three) times daily. 06/04/20  Yes [provider]  hydrocortisone (CORTEF) 5 MG tablet Take 5-15 mg by mouth See admin instructions. Take 3 tablets (15 mg) by mouth every morning  and take 1 tablet (5 mg) after 1pm 06/18/20  Yes [provider]  ketotifen (ZADITOR) 0.025 % ophthalmic solution Place 1 drop into both eyes 2 (two) times daily as needed (allergies).   Yes [provider]  levothyroxine (SYNTHROID) 50 MCG tablet Take 50 mcg by mouth daily before breakfast. 06/18/20  Yes [provider]  OVER THE COUNTER MEDICATION Take 5 mLs by mouth daily. Dandelion Root   Yes [provider]  OVER THE COUNTER MEDICATION Take 5 mLs by mouth every evening. Burdock Root   Yes [provider]  OVER THE COUNTER MEDICATION Take 1 capsule by mouth every morning. Alpha brain   Yes [provider]  polyethylene glycol (MIRALAX / GLYCOLAX) 17 g packet Take 17 g by mouth daily as needed (constipation).   Yes [provider]  repaglinide (PRANDIN) 0.5 MG tablet Take 0.5 mg by mouth 2 (two) times daily before a meal. 06/04/20  Yes [provider]    Inpatient Medications: Scheduled Meds:  sodium chloride   Intravenous Once   brimonidine  1 drop Both Eyes  q morning   insulin aspart  5 Units Intravenous Once   And   dextrose  1 ampule Intravenous Once   hydrocortisone sod succinate (SOLU-CORTEF) inj  100 mg Intravenous Q8H   insulin aspart  0-9 Units Subcutaneous Q4H   latanoprost  1 drop Both Eyes QHS   levothyroxine  50 mcg Oral QAC breakfast   Continuous Infusions:  azithromycin Stopped (01/03/2021 1928)   cefTRIAXone (ROCEPHIN)  IV     PRN Meds: albuterol, polyethylene glycol  Allergies:    Allergies  Allergen Reactions   Lisinopril Swelling    Facial/lip swelling     Social History:   Social History   Socioeconomic History   Marital status: Single    Spouse name: Not on file   Number of children: Not on file   Years of education: Not on file   Highest education level: Not on file  Occupational History   Not on file  Tobacco Use   Smoking status: Never   Smokeless tobacco: Never  Substance and  Sexual Activity   Alcohol use: Not Currently   Drug use: Never   Sexual activity: Not on file  Other Topics Concern   Not on file  Social History Narrative   Not on file   Social Determinants of Health   Financial Resource Strain: Not on file  Food Insecurity: Not on file  Transportation Needs: Not on file  Physical Activity: Not on file  Stress: Not on file  Social Connections: Not on file  Intimate Partner Violence: Not on file    Family History:    Family History  Problem Relation Age of Onset   Hypertension Other      ROS:  Unable to obtain as patient is demented with acute metabolic encephalopathy  Physical Exam/Data:   Vitals:   12/22/20 1230 12/22/20 1245 12/22/20 1300 12/22/20 1315  BP: (!) 150/124 (!) 132/59 (!) 123/59 132/62  Pulse: 69 70 68 68  Resp: 19 17 16 12   Temp:      TempSrc:      SpO2: 96% 99% 97% 98%  Weight:      Height:        Intake/Output Summary (Last 24 hours) at 12/22/2020 1351 Last data filed at 12/22/2020 0816 Gross per 24 hour  Intake 1107.67 ml  Output --  Net 1107.67 ml   Last 3 Weights 12/16/2020  Weight (lbs) 152 lb  Weight (kg) 68.947 kg     Body mass index is 26.93 kg/m.   Vitals:  Vitals:   12/22/20 1300 12/22/20 1315  BP: (!) 123/59 132/62  Pulse: 68 68  Resp: 16 12  Temp:    SpO2: 97% 98%   General Appearance: In no apparent distress, laying in bed HEENT: Normocephalic, atraumatic.  Moon facies  neck: Supple, trachea midline, difficult assess JVD due to position  Cardiovascular: Regular rate and rhythm, normal U2-V2, systolic murmur grade III of LSB Respiratory: Resting breathing unlabored, lungs sounds with rhonchi on auscultation bilaterally, no use of accessory muscles. On 3LNC.  Gastrointestinal: Bowel sounds positive, abdomen soft  Extremities: Able to move all extremities in bed spontaneously, non pitting edema of BLE Genitourinary: Suction Foley catheter with amber-colored urine Musculoskeletal: Normal  muscle bulk and tone, unable assess muscle strength  Skin: Intact, warm, dry.  Neurologic: Sleepy, open eyes to loud voice, oriented to self only, followed one-step command, unable to provide reliable history, nodded vertically when asked if she has any pain Psychiatric: Calm  EKG:  The EKG was personally reviewed and demonstrates:  Admission EKG sinus bradycardia 50s, non-specific IVCD, repeat EKG yesterday with ventricular tate of 30s no acute chnages   Telemetry:  Telemetry was personally reviewed and demonstrates:  sinus rhythm with rate of 60s currently   Relevant CV Studies:  Echocardiogram from 11/24/2020:  1. Normal LV function; mild concentric LVH with moderate basal septal  hypertrophy; mildly elevated LVOT velocity (2.2 m/s).   2. Left ventricular ejection fraction, by estimation, is 60 to 65%. The  left ventricle has normal function. The left ventricle has no regional  wall motion abnormalities. There is mild left ventricular hypertrophy.  Left ventricular diastolic parameters  are consistent with Grade I diastolic dysfunction (impaired relaxation).   3. Right ventricular systolic function is normal. The right ventricular  size is normal.   4. Left atrial size was mildly dilated.   5. A small pericardial effusion is present.   6. The mitral valve is normal in structure. Mild to moderate mitral valve  regurgitation. No evidence of mitral stenosis.   7. The aortic valve is tricuspid. Aortic valve regurgitation is trivial.  Mild aortic valve sclerosis is present, with no evidence of aortic valve  stenosis.   8. The inferior vena cava is normal in size with greater than 50%  respiratory variability, suggesting right atrial pressure of 3 mmHg.    Laboratory Data:  High Sensitivity Troponin:   Recent Labs  Lab 12/15/2020 2049 12/22/20 0115 12/22/20 0726  TROPONINIHS 98* 105* 108*     Chemistry Recent Labs  Lab 12/23/2020 1700 12/23/2020 2046 12/20/2020 2049  12/22/20 0800  NA 141 145 142 145  K 6.1* 4.2 4.1 4.3  CL 106  --  108 109  CO2 23  --  24 24  GLUCOSE 180*  --  131* 135*  BUN 62*  --  63* 65*  CREATININE 4.13*  --  4.16* 4.26*  CALCIUM 9.9  --  9.8 10.0  GFRNONAA 10*  --  10* 10*  ANIONGAP 12  --  10 12    Recent Labs  Lab 01/11/2021 1700 01/11/2021 2049 12/22/20 0800  PROT 5.5* 6.0* 6.7  ALBUMIN 3.1* 2.9* 3.4*  AST 471* 327* 218*  ALT 263* 234* 219*  ALKPHOS 130* 115 131*  BILITOT 1.1 0.4 0.7   Hematology Recent Labs  Lab 12/16/2020 2046 01/11/2021 2050 12/22/20 0726 12/22/20 0800  WBC  --  9.2  --  9.6  RBC  --  2.93* 3.78* 3.68*  HGB 8.8* 7.4*  --  9.2*  HCT 26.0* 25.6*  --  32.2*  MCV  --  87.4  --  87.5  MCH  --  25.3*  --  25.0*  MCHC  --  28.9*  --  28.6*  RDW  --  19.9*  --  19.9*  PLT  --  308  --  330   BNP Recent Labs  Lab 01/13/2021 2050  BNP 913.4*    DDimer No results for input(s): DDIMER in the last 168 hours.   Radiology/Studies:  CT HEAD WO CONTRAST (5MM)  Result Date: 01/06/2021 CLINICAL DATA:  Mental status change, unknown cause mental status change EXAM: CT HEAD WITHOUT CONTRAST TECHNIQUE: Contiguous axial images were obtained from the base of the skull through the vertex without intravenous contrast. COMPARISON:  Head CT 06/07/2020. FINDINGS: Brain: Heterogeneous density in the sella turcica of with sellar expansion, not significantly changed from prior head CT. No acute intracranial hemorrhage. Stable generalized  atrophy and chronic small vessel ischemia. No subdural or extra-axial collection. No evidence of acute ischemia. No midline shift. Vascular: No hyperdense vessel. Skull: No skull fracture. Sinuses/Orbits: Total opacification of right mastoid air cells, unchanged from prior exam. Subtotal opacification of lower left mastoid air cells, also unchanged. Bilateral cataract resection. Other: None. IMPRESSION: 1. No acute intracranial abnormality. 2. Stable atrophy and chronic small vessel  ischemia. 3. Heterogeneous density in the sella turcica with sellar expansion, not significantly changed from prior head CT. Patient with reported history of prior pituitary surgery. This is incompletely assessed by CT. As clinically indicated, recommend further assessment with pituitary protocol MRI. 4. Bilateral mastoid effusions, unchanged from prior. Electronically Signed   By: Keith Rake M.D.   On: 12/15/2020 18:53   DG Chest Portable 1 View  Result Date: 01/01/2021 CLINICAL DATA:  Mental status change EXAM: PORTABLE CHEST 1 VIEW COMPARISON:  06/07/2020 FINDINGS: Low lung volumes. Interim development of patchy airspace disease and consolidation in the left thorax. Cardiomegaly with aortic atherosclerosis. IMPRESSION: 1. Interim development of airspace disease and consolidation in left thorax concerning for pneumonia 2. Cardiomegaly Electronically Signed   By: Donavan Foil M.D.   On: 12/27/2020 16:23   US Abdomen Limited RUQ (LIVER/GB)  Result Date: 12/22/2020 CLINICAL DATA:  Elevated LFTs EXAM: ULTRASOUND ABDOMEN LIMITED RIGHT UPPER QUADRANT COMPARISON:  None. FINDINGS: Gallbladder: No shadowing stone. Upper normal gallbladder wall thickness. Negative sonographic Murphy. Common bile duct: Diameter: 3 mm Liver: No focal lesion identified. Within normal limits in parenchymal echogenicity. Portal vein is patent on color Doppler imaging with normal direction of blood flow towards the liver. Other: Right kidney appears slightly echogenic. IMPRESSION: 1. Negative for gallstones or biliary dilatation. 2. Right kidney appears slightly echogenic consistent with medical renal disease Electronically Signed   By: Donavan Foil M.D.   On: 12/15/2020 21:09     Assessment and Plan:   Sinus bradycardia -Admission EKG from 01/02/2021 reveals sinus bradycardia with nonspecific IVCD, no high-grade AV block, likely due to hyperkalemia and metabolic derangement from AKI - will repeat EKG today, telemetry with SR  60s currently  -Avoid AV nodal blocking agent -Treat underlying AKI on CKD V, electrolytes derangement, sepsis, etc.  -Consider discontinuing donezepil  Hyperkalemia Hypermagnesemia Hyperphosphatemia AKI on CKD V -Cr 4.13 and Potassium 6.1 POA, K is now WNL, trend BMP  -Consider nephrology consult for evaluation of appropriateness of dialysis if patient/family wishes and patient is eligible, versus goal of care discussion   Elevated troponin -High sensitive troponin 98 > 105 >108 -EKG with nonspecific change -She is not having any chest pain, although poor historian -Likely demand ischemia, if acute issues resolve, possibly consider outpatient ischemic evaluation with stress Myoview if patient and family wishes  Chronic diastolic heart failure -Echocardiogram from 11/24/2020 with mild concentric LVH, EF 60 to 65%, no regional wall motion abnormality, grade 1 DD, mild dilated LA, small pericardial effusion, mild to moderate MR, trivial AR -Clinically appears euvolemic - GDMT: Not candidate for beta-blocker use due to profound bradycardia, OK to resume Lasix 66m QOD when sepsis resolves   Hypertension - Blood pressure is fairly controlled for her age, on amlodipine and hydralazine at home, may resume both    Acute metabolic encephalopathy: due to infection and uremia  Acute hypoxic respiratory failure: on 3LNC currently, not on oxygen at home  Sepsis due to community-acquired pneumonia: On IV ceftriaxone and azithromycin Positive blood culture with staph epidermidis: Possible contaminant, watch fever and consider repeat cultures  Acute on chronic normocytic anemia: GI consulted, does not appreciate overt GI bleed, she was given blood transfusion with improvement of hemoglobin Transaminitis: Suspect ischemic hepatitis from sepsis 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  Agree with assessment and plan as noted above.  Changes made to the above note as needed.  Patient seen and independently examined with Margie Billet, NP.   We discussed all aspects of the encounter. I agree with the assessment and plan as stated above.     Bradycardia:   this is most likely due to her hyperkalemia.  Her hyperkalemia has been fixed and now her bradycardia has resolved.  She also has a very complex metabolic derangement and this is also likely contributing significantly to her heart rate and other issues.  2.  Acute on chronic kidney disease: She has progressively declining renal function.  She was admitted with complications related to hyperkalemia.  I do not think that she is a candidate for dialysis but nephrology would be the team to discuss that.  3.  Chronic diastolic heart failure.  She had an echocardiogram last month which reveals normal LV function.  She has grade 1 diastolic dysfunction.  I would not give a beta-blocker or diltiazem because of her recent history of bradycardia.   I think a palliative care/hospice care consult would be appropriate.   I have spent a total of 40 minutes with patient reviewing hospital  notes , telemetry, EKGs, labs and examining patient as well as establishing an assessment and plan that was discussed with the patient.  > 50% of time was spent in direct patient care.  [facility] will sign off.   Medication Recommendations:   Other recommendations (labs, testing, etc):   Follow up as an outpatient:   with her primary MD or palliative care .    Thayer Headings, Brooke Bonito., MD, [facility] 12/22/2020, 2:14 PM [address]  [place] Pager [phone]

## 2020-12-22 NOTE — Progress Notes (Signed)
PT Cancellation Note  Patient Details Name: Allison Wang. Biggins MRN: [id number] DOB: 1940/07/25   Cancelled Treatment:    Reason Eval/Treat Not Completed: Other (comment) RN requests we hold off on PT for now- pt was not able to rest all night and is now sleeping heavily. Will check back if time/schedule allow.   Windell Norfolk, DPT, PN2   Supplemental Physical Therapist [place]    Pager [phone] Acute Rehab Office [phone]

## 2020-12-22 NOTE — Consult Note (Addendum)
Consultation Note Date: 12/22/2020   Patient Name: Allison Wang  DOB: [birth year]  MRN: [id number]  Age / Sex: 80 y.o., female  PCP: Roselee Nova, MD Referring Physician: Florencia Reasons, MD  Reason for Consultation: Establishing goals of care  HPI/Patient Profile: 80 y.o. female  with past medical history of dementia, chronic diastolic CHF, DM2, CKD stage V, covid infection January 2022, gout, HTN, HLD, thyroid disease, and known pituitary disease. She presented to the emergency department  on 12/29/2020 with confusion. She was much weaker and lethargic compared to baseline. In the ED, she was found to be bradycardic and hypoxic requiring 3L oxygen. Chest x-ray revealed left thorax consolidation concerning for pneumonia. Creatinine 4.13, lactic acid elevated, BNP 913, and high sensitivity troponin 98>105>108.  Admitted to [facility] for bradycardia and CAP.   Clinical Assessment and Goals of Care: I have reviewed medical records including EPIC notes, labs and imaging, examined the patient and met at bedside with daughter [name] to discuss diagnosis, prognosis, GOC, EOL wishes, disposition, and options. Patient is sleeping throughout my visit but [name] reports she was awake earlier.   I introduced Palliative Medicine as specialized medical care for people living with serious illness. It focuses on providing relief from the symptoms and stress of a serious illness.   We discussed a brief life review of the patient. She is originally from [place]. 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 She never married. She has 5 children: [place] ([facility]), [name] ([place]), Linton Rump ([place]), [place]), and the only daughter [name] [facility]).   She lived alone in her home in [place] until she started forgetting to  take her medications. Family then decided she could no longer live alone, so she lived with [place] in [place] for about 3 years. She later went to live with [name] in [place] in July 2021. [name] felt that as the only daughter, her mother should be with her. As far as functional status, at baseline she is ambulatory with a walker. She requires assistance with bathing and dressing but can feed herself. She is incontinent of bowel and bladder. She attends adult daycare at [employer] 5 days per week.   We discussed her current illness and what it means in the larger context of her ongoing co-morbidities. Discussion was had regarding the diagnosis of dementia and its natural trajectory. We reviewed that dementia is a progressive, non-curable disease underlying the patient's current acute medical conditions. We reviewed specific indicators of end stage dementia, including inability to communicate, bed bound/non-ambulatory status, decreased oral intake, and incontinence of bowel/bladder.  I attempted to elicit values and goals of care important to the patient and family. [name] states her goal is for patient to return home. She indicates she is hopeful for improvement, but is very tearful throughout our discussion as she considers the possibility patient may not improve. [name] is unsure at this time whether she would agree to rehab. Discussed cons  of rehab for patients with dementia including unfamiliar environment and difficulty participating in therapy.   I expressed concern regarding patient's pneumonia, and possibility that it is due to aspiration. Provided education that patient likely has some degree of dysphagia, which is very common in patients with advanced dementia. Explained that dysphagia is irreversible. Discussed that SLP was unable to evaluate patient today because she couldn't stay awake. Briefly discussed that artificial feeding has not been shown to prolong life or promote quality of  life in patients with dysphagia from dementia.  Brief discussion was had regarding code status. Confirmed family would not want CPR, intubation, or defibrillation. Family would be ok with BiPAP.   At this time family are clear that they would like to continue current medical interventions with watchful waiting. They wish to continue to gain answers through clinical assessments and will make stepwise decisions based off of that information.  Discussed the importance of continued conversation with family and the medical team regarding overall plan of care and treatment options, ensuring decisions are within the context of the patient/family GOCs.   Questions and concerns were addressed. I provided [name] with PMT contact card and "hard choices" booklet and encouraged her to call with questions or concerns.    Primary decision maker: Patient does not have a documented HCPOA. [name] Ismael (daughter) is the spokesperson for the family, but makes major decisions through discussion with her siblings.     SUMMARY OF RECOMMENDATIONS   Continue current medical care with watchful waiting Goal of care is for patient to return home with [name] SLP evaluation pending PMT will follow-up with family tomorrow   Code Status/Advance Care Planning: Limited code  No CPR, no intubation, no defibrillation or cardioversion Ok with BiPAP, ACLS medications  Palliative Prophylaxis:  Aspiration, Oral Care, and Turn Reposition  Additional Recommendations (Limitations, Scope, Preferences): Full Scope Treatment and No Tracheostomy  Psycho-social/Spiritual:  Created space and opportunity for family to express thoughts and feelings regarding patient's current medical situation.  Emotional support provided   Prognosis:  Unable to determine  Discharge Planning: To Be Determined      Primary Diagnoses: Present on Admission:  CAP (community acquired pneumonia)  Bradycardia  Acute metabolic  encephalopathy  CKD (chronic kidney disease), stage V (HCC)  Dementia (HCC)  Essential hypertension  Chronic diastolic CHF (congestive heart failure) (HCC)  Acute on chronic renal failure (HCC)  Elevated LFTs  Hyperkalemia  Acute respiratory failure with hypoxia (HCC)  Anemia due to chronic kidney disease  Symptomatic anemia   I have reviewed the medical record, interviewed the patient and family, and examined the patient. The following aspects are pertinent.  Past Medical History:  Diagnosis Date   Chronic kidney disease (CKD) stage G4/A2, severely decreased glomerular filtration rate (GFR) between 15-29 mL/min/1.73 square meter and albuminuria creatinine ratio between 30-299 mg/g (HCC)    Dementia (HCC)    Hyperlipemia    Hypertension    Thyroid disease     Family History  Problem Relation Age of Onset   Hypertension Other    Scheduled Meds:  sodium chloride   Intravenous Once   brimonidine  1 drop Both Eyes q morning   insulin aspart  5 Units Intravenous Once   And   dextrose  1 ampule Intravenous Once   hydrocortisone sod succinate (SOLU-CORTEF) inj  100 mg Intravenous Q8H   insulin aspart  0-9 Units Subcutaneous Q4H   latanoprost  1 drop Both Eyes QHS   levothyroxine  50  mcg Oral QAC breakfast   Continuous Infusions:  azithromycin Stopped (01/02/2021 1928)   cefTRIAXone (ROCEPHIN)  IV     PRN Meds:.albuterol, polyethylene glycol Medications Prior to Admission:  Prior to Admission medications   Medication Sig Start Date End Date Taking? Authorizing Provider   Allergies  Allergen Reactions   Lisinopril Swelling    Facial/lip swelling    Review of Systems  Unable to perform ROS: Dementia   Physical Exam Vitals reviewed.  Constitutional:      General: She is sleeping. She is not in acute distress.    Appearance: She is ill-appearing.  Pulmonary:     Effort: Pulmonary effort is normal.     Comments: O2 at 4L Neurological:     Motor: Weakness present.   Psychiatric:        Cognition and Memory: Cognition is impaired.    Vital Signs: BP 128/60   Pulse (!) 55   Temp 98.5 F (36.9 C) (Oral)   Resp 13   Ht _0  (1.6 m)   Wt 68.9 kg   SpO2 100%   BMI 26.93 kg/m  Pain Scale: PAINAD      SpO2: SpO2: 100 % O2 Device:SpO2: 100 %   IO: Intake/output summary:  Intake/Output Summary (Last 24 hours) at 12/22/2020 1514 Last data filed at 12/22/2020 0816 Gross per 24 hour  Intake 1107.67 ml  Output --  Net 1107.67 ml    LBM:   Baseline Weight: Weight: 68.9 kg Most recent weight: Weight: 68.9 kg      Palliative Assessment/Data: PPS 10% (NPO)     Time In: 1715 Time Out: 1830 Time Total: 75 minutes Greater than 50%  of this time was spent counseling and coordinating care related to the above assessment and plan.  Signed by: Lavena Bullion, NP   Please contact Palliative Medicine Team phone at [phone] for questions and concerns.  For individual provider: See Shea Evans

## 2020-12-23 ENCOUNTER — Encounter (HOSPITAL_COMMUNITY): Payer: Self-pay | Admitting: Internal Medicine

## 2020-12-23 ENCOUNTER — Inpatient Hospital Stay (HOSPITAL_COMMUNITY): Payer: Medicare PPO

## 2020-12-23 DIAGNOSIS — R7989 Other specified abnormal findings of blood chemistry: Secondary | ICD-10-CM | POA: Diagnosis not present

## 2020-12-23 DIAGNOSIS — J189 Pneumonia, unspecified organism: Secondary | ICD-10-CM | POA: Diagnosis not present

## 2020-12-23 DIAGNOSIS — J9601 Acute respiratory failure with hypoxia: Secondary | ICD-10-CM | POA: Diagnosis not present

## 2020-12-23 DIAGNOSIS — N170 Acute kidney failure with tubular necrosis: Secondary | ICD-10-CM

## 2020-12-23 DIAGNOSIS — F0390 Unspecified dementia without behavioral disturbance: Secondary | ICD-10-CM | POA: Diagnosis not present

## 2020-12-23 DIAGNOSIS — G9341 Metabolic encephalopathy: Secondary | ICD-10-CM | POA: Diagnosis not present

## 2020-12-23 LAB — TYPE AND SCREEN
ABO/RH(D): B POS
Antibody Screen: NEGATIVE
Unit division: 0

## 2020-12-23 LAB — GLUCOSE, CAPILLARY
Glucose-Capillary: 111 mg/dL — ABNORMAL HIGH (ref 70–99)
Glucose-Capillary: 122 mg/dL — ABNORMAL HIGH (ref 70–99)
Glucose-Capillary: 122 mg/dL — ABNORMAL HIGH (ref 70–99)
Glucose-Capillary: 140 mg/dL — ABNORMAL HIGH (ref 70–99)
Glucose-Capillary: 142 mg/dL — ABNORMAL HIGH (ref 70–99)
Glucose-Capillary: 148 mg/dL — ABNORMAL HIGH (ref 70–99)
Glucose-Capillary: 160 mg/dL — ABNORMAL HIGH (ref 70–99)

## 2020-12-23 LAB — HEPATIC FUNCTION PANEL
ALT: 144 U/L — ABNORMAL HIGH (ref 0–44)
AST: 88 U/L — ABNORMAL HIGH (ref 15–41)
Albumin: 3 g/dL — ABNORMAL LOW (ref 3.5–5.0)
Alkaline Phosphatase: 106 U/L (ref 38–126)
Bilirubin, Direct: 0.1 mg/dL (ref 0.0–0.2)
Indirect Bilirubin: 0.6 mg/dL (ref 0.3–0.9)
Total Bilirubin: 0.7 mg/dL (ref 0.3–1.2)
Total Protein: 6.3 g/dL — ABNORMAL LOW (ref 6.5–8.1)

## 2020-12-23 LAB — BASIC METABOLIC PANEL
Anion gap: 16 — ABNORMAL HIGH (ref 5–15)
BUN: 73 mg/dL — ABNORMAL HIGH (ref 8–23)
CO2: 20 mmol/L — ABNORMAL LOW (ref 22–32)
Calcium: 10.3 mg/dL (ref 8.9–10.3)
Chloride: 109 mmol/L (ref 98–111)
Creatinine, Ser: 4.63 mg/dL — ABNORMAL HIGH (ref 0.44–1.00)
GFR, Estimated: 9 mL/min — ABNORMAL LOW (ref >60)
Glucose, Bld: 115 mg/dL — ABNORMAL HIGH (ref 70–99)
Potassium: 4.6 mmol/L (ref 3.5–5.1)
Sodium: 145 mmol/L (ref 135–145)

## 2020-12-23 LAB — URINE CULTURE: Culture: NO GROWTH

## 2020-12-23 LAB — BPAM RBC
Blood Product Expiration Date: 202208272359
ISSUE DATE / TIME: 202208090243
Unit Type and Rh: 7300

## 2020-12-23 LAB — LEGIONELLA PNEUMOPHILA SEROGP 1 UR AG: L. pneumophila Serogp 1 Ur Ag: NEGATIVE

## 2020-12-23 MED ORDER — ATROPINE SULFATE 1 MG/10ML IJ SOSY: 0.5000 mg | PREFILLED_SYRINGE | Freq: Once | INTRAMUSCULAR | Status: DC | PRN

## 2020-12-23 MED ORDER — FUROSEMIDE 10 MG/ML IJ SOLN
60.0000 mg | Freq: Once | INTRAMUSCULAR | Status: AC
  Administered 2020-12-23: 60 mg via INTRAVENOUS
  Filled 2020-12-23: qty 6

## 2020-12-23 MED ORDER — POLYETHYLENE GLYCOL 3350 17 G PO PACK
17.0000 g | PACK | Freq: Two times a day (BID) | ORAL | Status: DC
  Administered 2020-12-23: 17 g via ORAL
  Filled 2020-12-23 (×3): qty 1

## 2020-12-23 MED ORDER — BISACODYL 10 MG RE SUPP: 10.0000 mg | Freq: Every day | RECTAL | Status: DC | PRN

## 2020-12-23 NOTE — Evaluation (Signed)
Occupational Therapy Evaluation Patient Details Name: Allison Wang. Br[place]lee MRN: [id number] DOB: 03-06-1941 Today's Date: 12/23/2020    History of Present Illness Pt is an 80 y.o. female  admitted 12/20/2020 with CAP. PMH of dementia, chronic diastolic CHF, DM2, CKD stage V, covid infection January 2022, gout, HTN, HLD, thyroid disease, and known pituitary disease.   Clinical Impression   Pt ambulated with a RW and self fed. She was assisted for bathing, dressing and toileting and lives with her family per previous chart hx. No family available to provide home set up. Pt presents with generalized weakness and poor activity tolerance. She has baseline advanced dementia and was minimally verbal. Pt sat EOB x 5 minutes and participated in grooming with decreased thoroughness. She demonstrates tremulousness. Recommending SNF upon discharge.     Follow Up Recommendations  SNF    Equipment Recommendations  Other (comment) (TBD)    Recommendations for Other Services       Precautions / Restrictions Precautions Precautions: Fall      Mobility Bed Mobility Overal bed mobility: Needs Assistance Bed Mobility: Supine to Sit;Sit to Supine     Supine to sit: Mod assist;HOB elevated Sit to supine: Mod assist   General bed mobility comments: increased time, cues for sequencing, assist with trunk and BLE, tremulous    Transfers                 General transfer comment: pt declined OOB, fatigued with sitting at EOB    Balance Overall balance assessment: Needs assistance   Sitting balance-Leahy Scale: Fair Sitting balance - Comments: sat x 5 minutes, tremulous with poor tolerance, attempting to return to supine                                   ADL either performed or assessed with clinical judgement   ADL Overall ADL's : Needs assistance/impaired Eating/Feeding: Minimal assistance;Bed level   Grooming: Wash/dry face;Sitting;Moderate assistance Grooming Details  (indicate cue type and reason): decreased thoroughness                               General ADL Comments: dependent in bathing, dressing and toileting     Vision Patient Visual Report: No change from baseline       Perception     Praxis      Pertinent Vitals/Pain Pain Assessment: Faces Faces Pain Scale: No hurt     Hand Dominance Right   Extremity/Trunk Assessment Upper Extremity Assessment Upper Extremity Assessment: Generalized weakness (tremulous)   Lower Extremity Assessment Lower Extremity Assessment: Defer to PT evaluation   Cervical / Trunk Assessment Cervical / Trunk Assessment: Kyphotic   Communication     Cognition Arousal/Alertness: Awake/alert Behavior During Therapy: Flat affect Overall Cognitive Status: No family/caregiver present to determine baseline cognitive functioning                                 General Comments: minimal conversation, polite, answers yes and no with increased time, inconsistently follows commands   General Comments       Exercises     Shoulder Instructions      Home Living Family/patient expects to be discharged to:: Private residence Living Arrangements: Children (daughter) Available Help at Discharge: Family  Home Equipment: Walker - 2 wheels   Additional Comments: No family available to provide home set up. Pt with advanced dementia at baseline.      Prior Functioning/Environment                   OT Problem List: Decreased strength;Impaired balance (sitting and/or standing);Decreased activity tolerance;Decreased cognition;Decreased knowledge of use of DME or AE      OT Treatment/Interventions: Self-care/ADL training;DME and/or AE instruction;Therapeutic activities;Patient/family education;Balance training    OT Goals(Current goals can be found in the care plan section) Acute Rehab OT Goals Patient Stated Goal: not stated OT Goal  Formulation: Patient unable to participate in goal setting Time For Goal Achievement: 01/06/21 Potential to Achieve Goals: Fair ADL Goals Pt Will Perform Eating: Independently;sitting Pt Will Perform Grooming: with min assist;standing Pt Will Perform Upper Body Dressing: with min assist;sitting Pt Will Transfer to Toilet: with min assist;ambulating;bedside commode Additional ADL Goal #1: Pt will tolerate 15 minutes of activity.  OT Frequency: Min 2X/week   Barriers to D/C:            Co-evaluation              AM-PAC OT "6 Clicks" Daily Activity     Outcome Measure Help from another person eating meals?: A Little Help from another person taking care of personal grooming?: A Lot Help from another person toileting, which includes using toliet, bedpan, or urinal?: Total Help from another person bathing (including washing, rinsing, drying)?: Total Help from another person to put on and taking off regular upper body clothing?: Total Help from another person to put on and taking off regular lower body clothing?: Total 6 Click Score: 9   End of Session    Activity Tolerance: Patient tolerated treatment well Patient left: in bed;with call bell/phone within reach;with bed alarm set  OT Visit Diagnosis: Muscle weakness (generalized) (M62.81);Other symptoms and signs involving cognitive function                Time: 0539-7673 OT Time Calculation (min): 12 min Charges:  OT General Charges $OT Visit: 1 Visit OT Evaluation $OT Eval Moderate Complexity: 1 Mod  Nestor Lewandowsky, OTR/L Acute Rehabilitation Services Pager: [phone] Office: [phone]   Malka So 12/23/2020, 2:01 PM

## 2020-12-23 NOTE — Evaluation (Addendum)
Physical Therapy Evaluation Patient Details Name: Allison Wang MRN: [id number] DOB: Sep 15, 1940 Today's Date: 12/23/2020   History of Present Illness  Pt is an 80 y.o. female  admitted 12/22/2020 with CAP. PMH of dementia, chronic diastolic CHF, DM2, CKD stage V, covid infection January 2022, gout, HTN, HLD, thyroid disease, and known pituitary disease.   Clinical Impression  Pt admitted with above diagnosis. PTA pt lived at home with daughter, ambulatory with RW. On eval, pt required mod assist bed mobility, and min guard assist sitting EOB. Pt shaky/tremulous while sitting requiring return to supine. Pt on 6L HFNC during session with VSS. Mobility significantly limited by fatigue and weakness. Pt currently with functional limitations due to the deficits listed below (see PT Problem List). Pt will benefit from skilled PT to increase their independence and safety with mobility to allow discharge to the venue listed below.       Follow Up Recommendations SNF    Equipment Recommendations  Other (comment) (TBD)    Recommendations for Other Services       Precautions / Restrictions Precautions Precautions: Fall;Other (comment) Precaution Comments: watch vitals      Mobility  Bed Mobility Overal bed mobility: Needs Assistance Bed Mobility: Supine to Sit;Sit to Supine     Supine to sit: Mod assist;HOB elevated Sit to supine: Mod assist   General bed mobility comments: increased time, cues for sequencing, assist with trunk and BLE Patient Response: Flat affect  Transfers                 General transfer comment: unable to safely transition beyond EOB due to fatigue/weakness  Ambulation/Gait             General Gait Details: unable  Stairs            Wheelchair Mobility    Modified Rankin (Stroke Patients Only)       Balance Overall balance assessment: Needs assistance Sitting-balance support: Feet supported;No upper extremity supported Sitting  balance-Leahy Scale: Fair Sitting balance - Comments: Pt sat EOB x 2-3 minutes min guard assist. Pt became shaky/tremulous and began attempting to transition to R sidelying. When asked do you need to lie down, pt shook her head yes.                                     Pertinent Vitals/Pain Pain Assessment: Faces Faces Pain Scale: No hurt    Home Living Family/patient expects to be discharged to:: Private residence Living Arrangements: Children (daughter) Available Help at Discharge: Family           Home Equipment: Gilford Rile - 2 wheels Additional Comments: No family available to provide home set up. Pt with advanced dementia at baseline.    Prior Function           Comments: Per chart, pt lived at home with her daughter. Mod I mobility with RW and able to feed herself.     Hand Dominance        Extremity/Trunk Assessment   Upper Extremity Assessment Upper Extremity Assessment: Defer to OT evaluation    Lower Extremity Assessment Lower Extremity Assessment: Generalized weakness    Cervical / Trunk Assessment Cervical / Trunk Assessment: Kyphotic  Communication      Cognition Arousal/Alertness: Awake/alert Behavior During Therapy: Flat affect Overall Cognitive Status: No family/caregiver present to determine baseline cognitive functioning  General Comments: Advanced dementia at baseline. Therapist spoke "Good morning" upon entering room and pt replied "good morning." That was the only verbalization during session. She followed simple commands 50% of time.      General Comments General comments (skin integrity, edema, etc.): VSS on 6L O2 via HFNC    Exercises     Assessment/Plan    PT Assessment Patient needs continued PT services  PT Problem List Decreased strength;Decreased mobility;Decreased safety awareness;Decreased knowledge of precautions;Decreased activity tolerance;Cardiopulmonary status  limiting activity;Decreased balance       PT Treatment Interventions Therapeutic activities;Gait training;Therapeutic exercise;Patient/family education;Balance training;Functional mobility training    PT Goals (Current goals can be found in the Care Plan section)  Acute Rehab PT Goals Patient Stated Goal: not stated PT Goal Formulation: Patient unable to participate in goal setting Time For Goal Achievement: 01/06/21 Potential to Achieve Goals: Fair    Frequency Min 3X/week   Barriers to discharge        Co-evaluation               AM-PAC PT "6 Clicks" Mobility  Outcome Measure Help needed turning from your back to your side while in a flat bed without using bedrails?: A Lot Help needed moving from lying on your back to sitting on the side of a flat bed without using bedrails?: A Lot Help needed moving to and from a bed to a chair (including a wheelchair)?: Total Help needed standing up from a chair using your arms (e.g., wheelchair or bedside chair)?: Total Help needed to walk in hospital room?: Total Help needed climbing 3-5 steps with a railing? : Total 6 Click Score: 8    End of Session   Activity Tolerance: Patient limited by fatigue Patient left: in bed;with call bell/phone within reach;with bed alarm set Nurse Communication: Mobility status PT Visit Diagnosis: Muscle weakness (generalized) (M62.81);Difficulty in walking, not elsewhere classified (R26.2)    Time: 3149-7026 PT Time Calculation (min) (ACUTE ONLY): 19 min   Charges:   PT Evaluation $PT Eval Moderate Complexity: 1 Mod          Allison Wang, PT  Office # [phone] Pager [phone]   Allison Wang 12/23/2020, 10:13 AM

## 2020-12-23 NOTE — Progress Notes (Signed)
[facility] Progress Note  Name: Allison Wang MRN: [id number] DOB: Nov 09, 1940  Chief Complaint:  ams  Subjective:  No urine output is charted over 8/9.  Spoke with her son at bedside to update him.  He states that she has had trouble with swelling  Review of systems:  Not able to obtain reliably 2/2 dementia; she does deny shortness of breath   ------ Background on consult:  Allison Wang is a 80 y.o. female with a history of CKD stage V, dementia, and HTN who presented to [facility] with confusion.  Family requested nephrology consultation.  CXR in the ER had concern for PNA.  She required 3 liters of oxygen in ER and has been on 5 liters as of this afternoon on floor.  Her daughter states that for about the past year the patient has not always been oriented to self and has not always been able to recognize her children or family.  She is usually able to ambulate with a walker. They reported being told that she was not a candidate for dialysis in the past.  She has seen Dr. Joylene Grapes at [facility] on 11/26/20.  He charted severe dementia and that she would not be a candidate for dialysis.  Cr 2.83, eGFR 16, BUN 42 on 11/26/20.  During our exam she was agitated at one point and pulled at her teeth as if attempting to take them out.  Her daughter took out her lower dentures and we got a box for those from nursing.  Her nurse was able to take out the top set of dentures.  Her daughter states that the patient is not normally this sleepy.  She is not sure of her mother's lasix dose.  No intake or output data in the 24 hours ending 12/23/20 1307  Vitals:  Vitals:   12/23/20 0000 12/23/20 0400 12/23/20 0800 12/23/20 1208  BP: (!) 145/55 (!) 133/49 (!) 147/45 (!) 147/51  Pulse: 63 63 67 65  Resp: 19 19 14 14   Temp: 97.7 F (36.5 C) (!) 97.1 F (36.2 C) 97.7 F (36.5 C) 97.6 F (36.4 C)  TempSrc: Axillary Axillary Axillary Oral  SpO2: 100% 100% 100% 99%  Weight:       Height:         Physical Exam:  General: Elderly female in bed minimally interactive with exam HEENT: Normocephalic atraumatic Eyes: Eyes most often closed; periorbital edema Neck: Supple trachea midline Heart: S1-S2 no rub Lungs: Clear to auscultation anteriorly on oxygen per nasal cannula Abdomen: Soft nontender nondistended Extremities: 1+ edema lower extremities Skin: No rash on extremities exposed Neuro: mostly nonverbal and sleeps during exam; she does say no when asked if having trouble breathing; She does open her eyes to the phrase "Allison Wang  Medications reviewed   Labs:  BMP Latest Ref Rng & Units 12/22/2020 12/14/2020 01/04/2021  Glucose 70 - 99 mg/dL 135(H) 131(H) -  BUN 8 - 23 mg/dL 65(H) 63(H) -  Creatinine 0.44 - 1.00 mg/dL 4.26(H) 4.16(H) -  Sodium 135 - 145 mmol/L 145 142 145  Potassium 3.5 - 5.1 mmol/L 4.3 4.1 4.2  Chloride 98 - 111 mmol/L 109 108 -  CO2 22 - 32 mmol/L 24 24 -  Calcium 8.9 - 10.3 mg/dL 10.0 9.8 -     Assessment/Plan:   # AKI - Ischemic and pre-renal insults in the setting of infection/PNA - hyperkalemia has resolved - NPO and would restrict dietary potassium once taking PO/more awake   #  CKD stage V - She is not always oriented to person and sometimes does not recognize family.  Nephrology has previously characterized her as a poor dialysis candidate given her dementia.  I do agree that she is a not a candidate for dialysis and her daughter and I thoughtfully discussed this   # Acute respiratory failure with hypoxia - Secondary to PNA as well as some component of pulm edema - agree with lasix 60 mg IV once and lasix as needed to optimize cardiopulmonary status   # PNA - Abx per primary team   # Dementia - when at her baseline she is not always oriented to person for the past year   # AMS - family reports she is not normally this sleepy   # HTN - controlled   # Transaminitis - indicative of possible ischemic insult; improved  slightly     # Normocytic anemia - no acute indication for PRBC's ; improved  She is not a candidate for dialysis.  Would continue supportive measures as align with goals of care.  Will sign off.  Please do not hesitate to contact me with any questions.  Spoke with primary team.   Claudia Desanctis, MD 12/23/2020 1:30 PM

## 2020-12-23 NOTE — Progress Notes (Signed)
Initial Nutrition Assessment  DOCUMENTATION CODES:   Not applicable  INTERVENTION:   -Feeding assistance with meals -MVI with minerals daily -Magic cup TID with meals, each supplement provides 290 kcal and 9 grams of protein  -Ensure Enlive po BID, each supplement provides 350 kcal and 20 grams of protein   NUTRITION DIAGNOSIS:   Predicted suboptimal nutrient intake related to chronic illness (dementia) as evidenced by per patient/family report.  GOAL:   Patient will meet greater than or equal to 90% of their needs  MONITOR:   PO intake, Supplement acceptance, Diet advancement, Labs, Weight trends, Skin, I & O's  REASON FOR ASSESSMENT:   Consult Assessment of nutrition requirement/status  ASSESSMENT:   Allison Wang is a 80 y.o. female with medical history significant of DM2, dementia, CKD, thyroid disease, HTN, HLD covid infection Jan 2022, gout, chronic diastolic CHF  Pt admitted with CAP and acute respiratory failure with hypoxia, and bradycardia.   Reviewed I/O's: +443 ml x 24 hours and +1.1 L since admission  Spoke with pt at bedside, who was able to communicate with this RD with head nods and some close ended questions. She reports she has not eaten anything today, but feels her swallow function is improving. No meal completion data available at this time. Predict suboptimal oral intake secondary to dementia and unfamiliar surroundings in the hospital.   No wt history available to assess wt changes at this time. Edema may be masking further weight loss ans well as fat and muscle depletions.   Palliative care following for goals of care discussions; plan to continue current medical care with watchful waiting.   Medications reviewed and include solu-medrol.   Labs reviewed: CBGS: 122-140 (inpatient orders for glycmeic control are 0-9 units insulin aspart every 4 hours).   NUTRITION - FOCUSED PHYSICAL EXAM:  Flowsheet Row Most Recent Value  Orbital Region  No depletion  Upper Arm Region No depletion  Thoracic and Lumbar Region No depletion  Buccal Region No depletion  Temple Region No depletion  Clavicle Bone Region No depletion  Clavicle and Acromion Bone Region No depletion  Scapular Bone Region No depletion  Dorsal Hand No depletion  Patellar Region No depletion  Anterior Thigh Region No depletion  Posterior Calf Region No depletion  Edema (RD Assessment) Mild  Hair Reviewed  Eyes Reviewed  Mouth Reviewed  Skin Reviewed  Nails Reviewed       Diet Order:   Diet Order             DIET DYS 3 Room service appropriate? Yes; Fluid consistency: Thin  Diet effective now                   EDUCATION NEEDS:   No education needs have been identified at this time  Skin:  Skin Assessment: Reviewed RN Assessment  Last BM:  12/23/20  Height:   Ht Readings from Last 1 Encounters:  12/14/2020 5\' 3"  (1.6 m)    Weight:   Wt Readings from Last 1 Encounters:  12/14/2020 68.9 kg    Ideal Body Weight:  52.3 kg  BMI:  Body mass index is 26.93 kg/m.  Estimated Nutritional Needs:   Kcal:  1700-1900  Protein:  85-100 grams  Fluid:  > 1.7 L    Loistine Chance, RD, LDN, [place] Registered Dietitian II Certified Diabetes Care and Education Specialist Please refer to [facility] for RD and/or RD on-call/weekend/after hours pager

## 2020-12-23 NOTE — Progress Notes (Addendum)
Attending physician's note   I have taken an interval history, reviewed the chart and examined the patient. I agree with the Advanced Practitioner's note, impression, and recommendations as outlined.   Liver enzymes downtrending today.  AST/ALT/ALP at 88/144/106 (from 218/219/131) with normal T bili.  I discussed her hepatic care with her son at bedside today.  Suspect elevated liver enzymes 2/2 infection/systemic injury.  Thankfully downtrending and no evidence of obstruction that should require ERCP or other GI intervention.  I have no objection to adding low-dose PPI for gastric prophylaxis in the setting of pneumonia and reduced mobility.  GI service will sign off at this time.  Please do not hesitate to contact us with additional questions or concerns.  [place], DO, FACG [phone] office                                                                      Daily Rounding Note  12/23/2020, 12:24 PM  LOS: 2 days   SUBJECTIVE:   Chief complaint:    Elevated LFTs.  Patient is not very verbal but she denies pain, denies nausea. Dysphagia 3 diet in place.  Percentage of meal take not yet recorded.  OBJECTIVE:         Vital signs in last 24 hours:    Temp:  [97 F (36.1 C)-97.9 F (36.6 C)] 97.6 F (36.4 C) (08/10 1208) Pulse Rate:  [54-71] 65 (08/10 1208) Resp:  [12-20] 14 (08/10 1208) BP: (101-150)/(45-124) 147/51 (08/10 1208) SpO2:  [91 %-100 %] 99 % (08/10 1208)   Filed Weights   01/02/2021 1555  Weight: 68.9 kg   General: Alert but not speaking to me.  Appears chronically ill, cushingoid Heart: RRR. Chest: No labored breathing or cough Abdomen: Nontender.  Soft.  Active bowel sounds Extremities: 1+ pretibial edema. Neuro/Psych: Does not consistently follow simple commands.  Eyes are open but she is not engaging much with BID.  Tremors in her upper extremities/hands bilaterally.  Intake/Output from previous day: 08/09 0701 - 08/10 0700 In: 442.7  [I.V.:442.7] Out: -   Intake/Output this shift: No intake/output data recorded.  Lab Results: Recent Labs    12/16/2020 2046 12/24/2020 2050 12/22/20 0800  WBC  --  9.2 9.6  HGB 8.8* 7.4* 9.2*  HCT 26.0* 25.6* 32.2*  PLT  --  308 330   BMET Recent Labs    01/10/2021 1700 01/01/2021 2046 01/04/2021 2049 12/22/20 0800  NA 141 145 142 145  K 6.1* 4.2 4.1 4.3  CL 106  --  108 109  CO2 23  --  24 24  GLUCOSE 180*  --  131* 135*  BUN 62*  --  63* 65*  CREATININE 4.13*  --  4.16* 4.26*  CALCIUM 9.9  --  9.8 10.0   LFT Recent Labs    01/05/2021 2049 12/22/20 0800 12/23/20 0210  PROT 6.0* 6.7 6.3*  ALBUMIN 2.9* 3.4* 3.0*  AST 327* 218* 88*  ALT 234* 219* 144*  ALKPHOS 115 131* 106  BILITOT 0.4 0.7 0.7  BILIDIR  --   --  0.1  IBILI  --   --  0.6   PT/INR Recent Labs    12/14/2020 2049 12/22/20 0800  LABPROT 13.3 13.3  INR 1.0 1.0   Hepatitis Panel Recent Labs    12/19/2020 2049  HEPBSAG NON REACTIVE  HCVAB NON REACTIVE  HEPAIGM NON REACTIVE  HEPBIGM NON REACTIVE    Studies/Results: CT HEAD WO CONTRAST (5MM)  Result Date: 01/02/2021 CLINICAL DATA:  Mental status change, unknown cause mental status change EXAM: CT HEAD WITHOUT CONTRAST TECHNIQUE: Contiguous axial images were obtained from the base of the skull through the vertex without intravenous contrast. COMPARISON:  Head CT 06/07/2020. FINDINGS: Brain: Heterogeneous density in the sella turcica of with sellar expansion, not significantly changed from prior head CT. No acute intracranial hemorrhage. Stable generalized atrophy and chronic small vessel ischemia. No subdural or extra-axial collection. No evidence of acute ischemia. No midline shift. Vascular: No hyperdense vessel. Skull: No skull fracture. Sinuses/Orbits: Total opacification of right mastoid air cells, unchanged from prior exam. Subtotal opacification of lower left mastoid air cells, also unchanged. Bilateral cataract resection. Other: None. IMPRESSION: 1. No  acute intracranial abnormality. 2. Stable atrophy and chronic small vessel ischemia. 3. Heterogeneous density in the sella turcica with sellar expansion, not significantly changed from prior head CT. Patient with reported history of prior pituitary surgery. This is incompletely assessed by CT. As clinically indicated, recommend further assessment with pituitary protocol MRI. 4. Bilateral mastoid effusions, unchanged from prior. Electronically Signed   By: Keith Rake M.D.   On: 01/01/2021 18:53   DG Chest Portable 1 View  Result Date: 12/28/2020 CLINICAL DATA:  Mental status change EXAM: PORTABLE CHEST 1 VIEW COMPARISON:  06/07/2020 FINDINGS: Low lung volumes. Interim development of patchy airspace disease and consolidation in the left thorax. Cardiomegaly with aortic atherosclerosis. IMPRESSION: 1. Interim development of airspace disease and consolidation in left thorax concerning for pneumonia 2. Cardiomegaly Electronically Signed   By: Donavan Foil M.D.   On: 12/20/2020 16:23   US Abdomen Limited RUQ (LIVER/GB)  Result Date: 12/14/2020 CLINICAL DATA:  Elevated LFTs EXAM: ULTRASOUND ABDOMEN LIMITED RIGHT UPPER QUADRANT COMPARISON:  None. FINDINGS: Gallbladder: No shadowing stone. Upper normal gallbladder wall thickness. Negative sonographic Murphy. Common bile duct: Diameter: 3 mm Liver: No focal lesion identified. Within normal limits in parenchymal echogenicity. Portal vein is patent on color Doppler imaging with normal direction of blood flow towards the liver. Other: Right kidney appears slightly echogenic. IMPRESSION: 1. Negative for gallstones or biliary dilatation. 2. Right kidney appears slightly echogenic consistent with medical renal disease Electronically Signed   By: Donavan Foil M.D.   On: 12/30/2020 21:09    Scheduled Meds: . sodium chloride   Intravenous Once  . brimonidine  1 drop Both Eyes q morning  . hydrocortisone sod succinate (SOLU-CORTEF) inj  100 mg Intravenous Q8H  .  insulin aspart  0-9 Units Subcutaneous Q4H  . latanoprost  1 drop Both Eyes QHS  . levothyroxine  50 mcg Oral QAC breakfast   Continuous Infusions: . azithromycin 500 mg (12/22/20 1747)  . cefTRIAXone (ROCEPHIN)  IV 2 g (12/22/20 2002)   PRN Meds:.albuterol, atropine, polyethylene glycol   ASSESMENT:      Elevated LFTs. 01/03/2021 abdominal ultrasound unremarkable. LFTs are improving and sepsis is likely etiology.  LFTs normal at the end of January 2022.    CAP, sepsis.  Blood cultures growing staph epidermidis, Staphylococcus simulans.  Blood culture PCR also detecting staphylococcal species and staph epidermidis.  On Rocephin, azithromycin, Solumedrol     AKI.  Background of advanced CKD, stage 5 in 05/2020.       Anemia. Hgb 7.4 >> 9.2,  10.9 in 05/2020.  FOBT positive w/O overt melena, BPR or active n/v, abd pain.   Due to age and comorbidities, diagnostic EGD and colonoscopy is not appropriate.  Her advanced CKD may be the cause of the anemia. Iron, iron sats low.  TIBC, ferritin, folate, B12 normal.  Hypothyroidism.  On Synthroid.  TSH normal.     NIDDM.  Prandin at home.    PLAN   No further investigation of her normalizing abnormal LFTs.  GI will sign off.  Please contact us if GI involvement needed in future.  No plans for office follow-up.      ? Add PPI or H2B acid prophylaxis?     Azucena Freed  12/23/2020, 12:24 PM Phone [phone]

## 2020-12-23 NOTE — Progress Notes (Signed)
Daily Progress Note   Patient Name: Allison Wang. Marvel Plan       Date: 12/23/2020 DOB: 1940-10-04  Age: 80 y.o. MRN#: [id number] Attending Physician: Elodia Florence., * Primary Care Physician: Roselee Nova, MD Admit Date: 01/01/2021  Reason for Consultation/Follow-up: goals of care  Subjective: Chart reviewed. Note that creatinine continues to increase (4.63 today from 4.26 yesterday)  I went to see patient at bedside. She is more alert today compared to yesterday. She tells me she is feeling "better". Her son Allison Wang) is at bedside. He reports he has been updated by nephrology, and it was discussed that patient would not be a candidate for dialysis. He reports family is in agreement with this.   Provided education that patient has dysphagia and is at risk for aspiration. Discussed that dysphagia is very common in patients with advanced dementia and is not reversible. Discussed current recommendations from SLP - dysphagia 3 diet with thin liquids.   I discussed with Allison Rinks that due to patient's advanced dementia and functional decline, it may be appropriate for transition to comfort care and hospice. Provided education on the philosophy and benefits of hospice care. Discussed that it offers a holistic approach to care in the setting of end-stage illness/disease, and is about supporting the patient where they are while allowing a natural course to occur.   Allison Rinks seems receptive to the concept of comfort and hospice care. I told Allison Rinks I did not discuss comfort care or hospice with Allison Wang yesterday because she seemed overwhelmed and needed time to process the situation. Allison Rinks reports that his sister Allison Wang is overwhelmed and is having a difficult time accepting their mother's current  medical condition. He reports the family is stepping up to support her and let her know she is not going through this alone.  Discussed the importance of continued conversation with family and the medical team regarding overall plan of care, ensuring decisions are within the context of the patients and family GOCs. Offered to arrange a family meeting or conference call in the next 1-2 days to discuss next steps.    Length of Stay: 2     Vital Signs: BP (!) 150/54 (BP Location: Right Leg)   Pulse 63   Temp 97.6 F (36.4 C) (Axillary)   Resp 12  Ht 5\' 3"  (1.6 m)   Wt 68.9 kg   SpO2 100%   BMI 26.93 kg/m  SpO2: SpO2: 100 % O2 Device: O2 Device: Nasal Cannula O2 Flow Rate: O2 Flow Rate (L/min): 6 L/min       Palliative Assessment/Data: PPS 30%      Palliative Care Assessment & Plan   HPI/Patient Profile: 80 y.o. female  with past medical history of dementia, chronic diastolic CHF, DM2, CKD stage V, covid infection January 2022, gout, HTN, HLD, thyroid disease, and known pituitary disease. She presented to the emergency department  on 12/15/2020 with confusion. She was much weaker and lethargic compared to baseline. In the ED, she was found to be bradycardic and hypoxic requiring 3L oxygen. Chest x-ray revealed left thorax consolidation concerning for pneumonia. Creatinine 4.13, lactic acid elevated, BNP 913, and high sensitivity troponin 98>105>108.  Admitted to [facility] for bradycardia and CAP.   Assessment: - community acquired pneumonia - acute hypoxic respiratory failure - bradycardia - AKI on CKD stage V - advanced dementia  Recommendations/Plan: Continue current supportive care Family understands and agrees patient would not be a candidate for hemodialysis No documented HCPOA, decisions will be made by patient's 5 children together PMT will arrange a family meeting or conference call  I have signed out to my colleague Josseline Burt Knack PA-C to follow-up starting  12/30/2020.  Goals of Care and Additional Recommendations: Limitations on Scope of Treatment: No Hemodialysis  Code Status: No CPR, no intubation, no defibrillation  Prognosis:  < 6 months  Discharge Planning: To Be Determined  Care plan was discussed with Dr. Florene Glen  Thank you for allowing the Palliative Medicine Team to assist in the care of this patient.   Total Time 35 minutes Prolonged Time Billed  no       Greater than 50%  of this time was spent counseling and coordinating care related to the above assessment and plan.  Lavena Bullion, NP  Please contact Palliative Medicine Team phone at [phone] for questions and concerns.

## 2020-12-23 NOTE — Progress Notes (Signed)
PROGRESS NOTE    Allison Wang. Marvel Plan  GMW:[id number] DOB: April 30, 1941 DOA: 12/17/2020 PCP: Roselee Nova, MD   Chief Complaint  Patient presents with   Altered Mental Status   Brief Narrative:  80 y.o. female with medical history significant of covid infection Jan 2022, gout,thyroid disease, HTN, HLD, NIDDM2, CKDV, chronic dCHF,  Pituitary disease, dementia,    Admitted for  CAP acute resp failure with hypoxia, bradycardia, aki, hyperkalemia.  Assessment & Plan:   Active Problems:   CAP (community acquired pneumonia)   Bradycardia   Acute metabolic encephalopathy   DM2 (diabetes mellitus, type 2) (HCC)   CKD (chronic kidney disease), stage V (HCC)   Dementia (HCC)   Essential hypertension   Chronic diastolic CHF (congestive heart failure) (HCC)   Acute on chronic renal failure (HCC)   Elevated LFTs   Hyperkalemia   Acute respiratory failure with hypoxia (HCC)   Anemia due to chronic kidney disease   Symptomatic anemia   Altered mental status  Acute hypoxic respiratory failure  Community Acquired Pneumonia  Volume Overload Currently requiring 4-6 L  CXR with bibasilar airspace disease L>R Ceftriaxone, azithromycin  Blood cultures positive, likely contaminant, see below Negative MRSA PCR, follow urine strep, urine leginella Sputum cx if possible SLP eval  - seen yesterday, difficult to arouse  - NPO at this time Appears overloaded, trial lasix and follow response  AKI on CKD 5  Volume Overload -renal function worsening today -thought due to ischemic/prerenal insults in setting of pneumonia  -renal notes poor dialysis candiate - signed off, recommending supportive measures  Positive Blood Cultures Suspect contaminants, follow repeat cultures  Acute metabolic encephalopathy with baseline dementia -does not recognize family members at baseline.   -daughter reports patient is more sleepy than normal -could be from pneumonia and aki -delirium precautions, w/u  additionally as needed   Elevation of LFT -CK unremarkable, right upper quadrant ultrasound no acute findings -Seen by GI, thought LFT elevation due to infection, no plan for ERCP -GI recommend treat underlying pneumonia, trend LFT   Acute on chronic anemia -Seen by GI do not think she is Shay Jhaveri candidate for EGD colonoscopy -Likely component of anemia of chronic disease -She received 1 unit of PRBC, hemoglobin increased from 7.4-9.2 -Monitor CBC   Sinus bradycardia/elevation of troponin Seen by cardiology Thought due to AKI hyperkalemia Troponin elevation due to demand ischemia, can follow outpatient for ischemic work-up if patient and family wishes Avoid AV nodal blocking agent, cardiology recommend discontinue aricept, cardiology recommend palliative care consult    Chronic diastolic CHF Seen by cardiology Patient not Shervin Cypert candidate for beta-blocker due to bradycardia Okay to resume Lasix 20 mg daily once infection resolves (trial of diuresis as noted above)   Non-insulin-dependent type 2 diabetes On DPP 4 inhibitor at home Currently on ssi  With hypoglycemia due to poor oral intake, on hypoglycemic protocol   Hypothyroidism Continue Synthroid   Pituitary tumor -continue hydrocortisone (stress dose)   FTT: needs goals of care discussion, palliative care consulted   DVT prophylaxis: SCD Code Status:partial  Family Communication: son at bedside Disposition:   Status is: Inpatient  Remains inpatient appropriate because:Inpatient level of care appropriate due to severity of illness  Dispo: The patient is from: Home              Anticipated d/c is to:  pending              Patient currently is not medically stable to d/c.  Difficult to place patient No       Consultants:  Renal Palliative GI cardiology  Procedures:  none  Antimicrobials: Anti-infectives (From admission, onward)    Start     Dose/Rate Route Frequency Ordered Stop   12/22/20 1900  cefTRIAXone  (ROCEPHIN) 2 g in sodium chloride 0.9 % 100 mL IVPB        2 g 200 mL/hr over 30 Minutes Intravenous Every 24 hours 12/15/2020 2001 12/27/20 1859   01/04/2021 1645  cefTRIAXone (ROCEPHIN) 1 g in sodium chloride 0.9 % 100 mL IVPB        1 g 200 mL/hr over 30 Minutes Intravenous  Once 12/28/2020 1632 12/29/2020 1837   01/02/2021 1645  azithromycin (ZITHROMAX) 500 mg in sodium chloride 0.9 % 250 mL IVPB        500 mg 250 mL/hr over 60 Minutes Intravenous Every 24 hours 01/05/2021 1632            Subjective: Lethargic, difficult to understand  Objective: Vitals:   12/23/20 0800 12/23/20 1208 12/23/20 1614 12/23/20 1952  BP: (!) 147/45 (!) 147/51 (!) 150/54 (!) 103/58  Pulse: 67 65 63 60  Resp: 14 14 12 18   Temp: 97.7 F (36.5 C) 97.6 F (36.4 C) 97.6 F (36.4 C) (!) 96.2 F (35.7 C)  TempSrc: Axillary Axillary Axillary Axillary  SpO2: 100% 99% 100% 92%  Weight:      Height:       No intake or output data in the 24 hours ending 12/23/20 2028 Filed Weights   12/25/2020 1555  Weight: 68.9 kg    Examination:  General exam: Appears calm and comfortable  Respiratory system: Clear to auscultation. Respiratory effort normal. Cardiovascular system: S1 & S2 heard, RRR. Gastrointestinal system: Abdomen is nondistended, soft and nontender.  Central nervous system: Confused.  Lethargic. Not consistently following commands. Extremities: anasarca, upper>lower edema Skin: No rashes, lesions or ulcers    Data Reviewed: I have personally reviewed following labs and imaging studies  CBC: Recent Labs  Lab 12/30/2020 2046 12/16/2020 2050 12/22/20 0800  WBC  --  9.2 9.6  NEUTROABS  --  8.2* 9.1*  HGB 8.8* 7.4* 9.2*  HCT 26.0* 25.6* 32.2*  MCV  --  87.4 87.5  PLT  --  308 494    Basic Metabolic Panel: Recent Labs  Lab 12/31/2020 1700 12/19/2020 2046 12/17/2020 2049 12/22/20 0800 12/23/20 0210  NA 141 145 142 145 145  K 6.1* 4.2 4.1 4.3 4.6  CL 106  --  108 109 109  CO2 23  --  24 24 20*   GLUCOSE 180*  --  131* 135* 115*  BUN 62*  --  63* 65* 73*  CREATININE 4.13*  --  4.16* 4.26* 4.63*  CALCIUM 9.9  --  9.8 10.0 10.3  MG  --   --  2.4 2.5*  --   PHOS  --   --  4.7* 5.6*  --     GFR: Estimated Creatinine Clearance: 9 mL/min (Mischa Brittingham) (by C-G formula based on SCr of 4.63 mg/dL (H)).  Liver Function Tests: Recent Labs  Lab 12/25/2020 1700 12/18/2020 2049 12/22/20 0800 12/23/20 0210  AST 471* 327* 218* 88*  ALT 263* 234* 219* 144*  ALKPHOS 130* 115 131* 106  BILITOT 1.1 0.4 0.7 0.7  PROT 5.5* 6.0* 6.7 6.3*  ALBUMIN 3.1* 2.9* 3.4* 3.0*    CBG: Recent Labs  Lab 12/23/20 0511 12/23/20 0806 12/23/20 1211 12/23/20 1617 12/23/20 1948  GLUCAP  122* 140* 111* 142* 160*     Recent Results (from the past 240 hour(s))  Resp Panel by RT-PCR (Flu [place] Record&B, Covid) Nasopharyngeal Swab     Status: None   Collection Time: 12/23/2020  4:15 PM   Specimen: Nasopharyngeal Swab; Nasopharyngeal(NP) swabs in vial transport medium  Result Value Ref Range Status   SARS Coronavirus 2 by RT PCR NEGATIVE NEGATIVE Final    Comment: (NOTE) SARS-CoV-2 target nucleic acids are NOT DETECTED.  The SARS-CoV-2 RNA is generally detectable in upper respiratory specimens during the acute phase of infection. The lowest concentration of SARS-CoV-2 viral copies this assay can detect is 138 copies/mL. Shawneequa Baldridge negative result does not preclude SARS-Cov-2 infection and should not be used as the sole basis for treatment or other patient management decisions. Geneva Barrero negative result may occur with  improper specimen collection/handling, submission of specimen other than nasopharyngeal swab, presence of viral mutation(s) within the areas targeted by this assay, and inadequate number of viral copies(<138 copies/mL). Eyad Rochford negative result must be combined with clinical observations, patient history, and epidemiological information. The expected result is Negative.  Fact Sheet for Patients:   EntrepreneurPulse.com.au  Fact Sheet for Healthcare Providers:  IncredibleEmployment.be  This test is no t yet approved or cleared by the [place] FDA and  has been authorized for detection and/or diagnosis of SARS-CoV-2 by FDA under an Emergency Use Authorization (EUA). This EUA will remain  in effect (meaning this test can be used) for the duration of the COVID-19 declaration under Section 564(b)(1) of the Act, 21 U.S.C.section 360bbb-3(b)(1), unless the authorization is terminated  or revoked sooner.       Influenza Rivan Siordia by PCR NEGATIVE NEGATIVE Final   Influenza B by PCR NEGATIVE NEGATIVE Final    Comment: (NOTE) The Xpert Xpress SARS-CoV-2/FLU/RSV plus assay is intended as an aid in the diagnosis of influenza from Nasopharyngeal swab specimens and should not be used as Gabby Rackers sole basis for treatment. Nasal washings and aspirates are unacceptable for Xpert Xpress SARS-CoV-2/FLU/RSV testing.  Fact Sheet for Patients: EntrepreneurPulse.com.au  Fact Sheet for Healthcare Providers: IncredibleEmployment.be  This test is not yet approved or cleared by the [place] FDA and has been authorized for detection and/or diagnosis of SARS-CoV-2 by FDA under an Emergency Use Authorization (EUA). This EUA will remain in effect (meaning this test can be used) for the duration of the COVID-19 declaration under Section 564(b)(1) of the Act, 21 U.S.C. section 360bbb-3(b)(1), unless the authorization is terminated or revoked.  Performed at [facility], [place] [address] [place], [place] [address]   Culture, blood (routine x 2)     Status: Abnormal (Preliminary result)   Collection Time: 01/01/2021  5:30 PM   Specimen: BLOOD  Result Value Ref Range Status   Specimen Description BLOOD RIGHT ANTECUBITAL  Final   Special Requests   Final    BOTTLES DRAWN AEROBIC AND ANAEROBIC Blood Culture results may not be  optimal due to an inadequate volume of blood received in culture bottles   Culture  Setup Time   Final    GRAM POSITIVE COCCI IN BOTH AEROBIC AND ANAEROBIC BOTTLES CRITICAL RESULT CALLED TO, READ BACK BY AND VERIFIED WITH: PHARMD Benford Asch.LAWLES AT 6270 ON 12/22/2020 BY T.SAAD. Performed at [facility], [place] [address] [place], [place] [address]    Culture STAPHYLOCOCCUS EPIDERMIDIS (Tyrus Wilms)  Final   Report Status PENDING  Incomplete  Culture, blood (routine x 2)     Status: Abnormal (Preliminary result)  Collection Time: 12/16/2020  5:30 PM   Specimen: BLOOD  Result Value Ref Range Status   Specimen Description BLOOD RIGHT ANTECUBITAL  Final   Special Requests   Final    BOTTLES DRAWN AEROBIC AND ANAEROBIC Blood Culture results may not be optimal due to an inadequate volume of blood received in culture bottles   Culture  Setup Time   Final    GRAM POSITIVE COCCI IN BOTH AEROBIC AND ANAEROBIC BOTTLES CRITICAL VALUE NOTED.  VALUE IS CONSISTENT WITH PREVIOUSLY REPORTED AND CALLED VALUE.    Culture (Alvis Edgell)  Final    STAPHYLOCOCCUS SIMULANS Standardized susceptibility testing for this organism is not available. Performed at [facility], [place] [address], [place], [place] [address]    Report Status PENDING  Incomplete  Blood Culture ID Panel (Reflexed)     Status: Abnormal   Collection Time: 12/30/2020  5:30 PM  Result Value Ref Range Status   Enterococcus faecalis NOT DETECTED NOT DETECTED Final   Enterococcus Faecium NOT DETECTED NOT DETECTED Final   Listeria monocytogenes NOT DETECTED NOT DETECTED Final   Staphylococcus species DETECTED (Jayveon Convey) NOT DETECTED Final    Comment: CRITICAL RESULT CALLED TO, READ BACK BY AND VERIFIED WITH: PHARMD Gurman [place]land.LAWLES AT 1041 ON 12/22/2020 BY T.SAAD.    Staphylococcus aureus (BCID) NOT DETECTED NOT DETECTED Final   Staphylococcus epidermidis DETECTED (Richard Holz) NOT DETECTED Final    Comment: CRITICAL RESULT CALLED TO, READ BACK BY AND VERIFIED WITH: PHARMD Caison Hearn.LAWLES  AT 1041 ON 12/22/2020 BY T.SAAD.    Staphylococcus lugdunensis NOT DETECTED NOT DETECTED Final   Streptococcus species NOT DETECTED NOT DETECTED Final   Streptococcus agalactiae NOT DETECTED NOT DETECTED Final   Streptococcus pneumoniae NOT DETECTED NOT DETECTED Final   Streptococcus pyogenes NOT DETECTED NOT DETECTED Final   Tonianne Fine.calcoaceticus-baumannii NOT DETECTED NOT DETECTED Final   Bacteroides fragilis NOT DETECTED NOT DETECTED Final   Enterobacterales NOT DETECTED NOT DETECTED Final   Enterobacter cloacae complex NOT DETECTED NOT DETECTED Final   Escherichia coli NOT DETECTED NOT DETECTED Final   Klebsiella aerogenes NOT DETECTED NOT DETECTED Final   Klebsiella oxytoca NOT DETECTED NOT DETECTED Final   Klebsiella pneumoniae NOT DETECTED NOT DETECTED Final   Proteus species NOT DETECTED NOT DETECTED Final   Salmonella species NOT DETECTED NOT DETECTED Final   Serratia marcescens NOT DETECTED NOT DETECTED Final   Haemophilus influenzae NOT DETECTED NOT DETECTED Final   Neisseria meningitidis NOT DETECTED NOT DETECTED Final   Pseudomonas aeruginosa NOT DETECTED NOT DETECTED Final   Stenotrophomonas maltophilia NOT DETECTED NOT DETECTED Final   Candida albicans NOT DETECTED NOT DETECTED Final   Candida auris NOT DETECTED NOT DETECTED Final   Candida glabrata NOT DETECTED NOT DETECTED Final   Candida krusei NOT DETECTED NOT DETECTED Final   Candida parapsilosis NOT DETECTED NOT DETECTED Final   Candida tropicalis NOT DETECTED NOT DETECTED Final   Cryptococcus neoformans/gattii NOT DETECTED NOT DETECTED Final   Methicillin resistance mecA/C NOT DETECTED NOT DETECTED Final    Comment: Performed at [facility], [address], [place], [place] [address]  Urine Culture     Status: None   Collection Time: 12/20/2020  9:46 PM   Specimen: Urine, Catheterized  Result Value Ref Range Status   Specimen Description URINE, CATHETERIZED  Final   Special Requests NONE  Final   Culture    Final    NO GROWTH Performed at [place] [address] [place], [place] [address]  Report Status 12/23/2020 FINAL  Final  MRSA Next Gen by PCR, Nasal     Status: None   Collection Time: 12/20/2020  9:59 PM  Result Value Ref Range Status   MRSA by PCR Next Gen NOT DETECTED NOT DETECTED Final    Comment: (NOTE) The GeneXpert MRSA Assay (FDA approved for NASAL specimens only), is one component of Jarrick Fjeld comprehensive MRSA colonization surveillance program. It is not intended to diagnose MRSA infection nor to guide or monitor treatment for MRSA infections. Test performance is not FDA approved in patients less than 73 years old. Performed at [facility], [place] [address] [place], [place] [address]          Radiology Studies: DG CHEST PORT 1 VIEW  Result Date: 12/23/2020 CLINICAL DATA:  Hypoxia EXAM: PORTABLE CHEST 1 VIEW COMPARISON:  12/19/2020 FINDINGS: Left lower lobe consolidation unchanged. Mild right lower lobe airspace disease unchanged. Negative for pulmonary edema. No significant effusion. IMPRESSION: No interval change. Bibasilar airspace disease left greater than right unchanged. Electronically Signed   By: Franchot Gallo M.D.   On: 12/23/2020 15:55   US Abdomen Limited RUQ (LIVER/GB)  Result Date: 01/08/2021 CLINICAL DATA:  Elevated LFTs EXAM: ULTRASOUND ABDOMEN LIMITED RIGHT UPPER QUADRANT COMPARISON:  None. FINDINGS: Gallbladder: No shadowing stone. Upper normal gallbladder wall thickness. Negative sonographic Murphy. Common bile duct: Diameter: 3 mm Liver: No focal lesion identified. Within normal limits in parenchymal echogenicity. Portal vein is patent on color Doppler imaging with normal direction of blood flow towards the liver. Other: Right kidney appears slightly echogenic. IMPRESSION: 1. Negative for gallstones or biliary dilatation. 2. Right kidney appears slightly echogenic consistent with medical renal disease Electronically Signed   By: Donavan Foil  M.D.   On: 01/03/2021 21:09        Scheduled Meds:  sodium chloride   Intravenous Once   brimonidine  1 drop Both Eyes q morning   hydrocortisone sod succinate (SOLU-CORTEF) inj  100 mg Intravenous Q8H   insulin aspart  0-9 Units Subcutaneous Q4H   latanoprost  1 drop Both Eyes QHS   levothyroxine  50 mcg Oral QAC breakfast   polyethylene glycol  17 g Oral BID   Continuous Infusions:  azithromycin 500 mg (12/23/20 1654)   cefTRIAXone (ROCEPHIN)  IV 2 g (12/23/20 2011)     LOS: 2 days    Time spent: over 30 min    Fayrene Helper, MD [facility]   To contact the attending provider between 7A-7P or the covering provider during after hours 7P-7A, please log into the web site [email or web address] and access using universal [place] password for that web site. If you do not have the password, please call the hospital operator.  12/23/2020, 8:28 PM

## 2020-12-23 NOTE — Evaluation (Signed)
Clinical/Bedside Swallow Evaluation Patient Details  Name: Allison Leath. [place] MRN: [id number] Date of Birth: 1940/12/14  Today's Date: 12/23/2020 Time: SLP Start Time (ACUTE ONLY): 1215 SLP Stop Time (ACUTE ONLY): 1230 SLP Time Calculation (min) (ACUTE ONLY): 15 min  Past Medical History:  Past Medical History:  Diagnosis Date   Chronic kidney disease (CKD) stage G4/A2, severely decreased glomerular filtration rate (GFR) between 15-29 mL/min/1.73 square meter and albuminuria creatinine ratio between 30-299 mg/g (HCC)    Dementia (HCC)    Hyperlipemia    Hypertension    Thyroid disease    Past Surgical History:  Past Surgical History:  Procedure Laterality Date   BREAST SURGERY     PITUITARY SURGERY     HPI:  Pt is an 80 y.o. female who presented with AMS and admitted for CAP. CXR 8/8: Interim development of airspace disease and consolidation in left thorax concerning for pneumonia. PMH: DM2, dementia, CKD, thyroid disease, HTN, HLD COVID infection Jan 2022, gout, chronic diastolic CHF   Assessment / Plan / Recommendation Clinical Impression  Pt was seen for bedside swallow evaluation and she denied a history of dysphagia, but her reliability as a historian is questioned due to dementia. Pt was asleep upon SLP's arrival, but she roused with verbal and tactile stimulation and was able to maintain an adequate level of alertness for assessment. Oral mechanism exam was limited due to pt's difficulty following commands; however, oral motor strength and ROM appeared grossly WFL. Dentition was reduced. She demonstrated impulsive tendencies and attempted to drink eight ounces of water in a single set of consecutive swallows. Coughing was noted after intake of >4oz, suggesting aspiration. Use of verbal prompts to reduce intake rate was ineffective, but coughing was eliminated when bolus sizes were reduced by physically removing the straw. Mastication was mildly prolonged, but functional and oral  clearance was adequate. Pt's current diet of dysphagia 3 solids and thin liquids will be continued at this time. However, p.o. intake should be deferred if pt is unable to maintain an adequate level of alertness. SLP will continue to follow pt. SLP Visit Diagnosis: Dysphagia, unspecified (R13.10)    Aspiration Risk  Mild aspiration risk    Diet Recommendation Dysphagia 3 (Mech soft);Thin liquid   Liquid Administration via: Cup;Straw Medication Administration: Whole meds with liquid Supervision: Staff to assist with self feeding;Full supervision/cueing for compensatory strategies Compensations: Slow rate;Small sips/bites Postural Changes: Seated upright at 90 degrees    Other  Recommendations Oral Care Recommendations: Oral care BID   Follow up Recommendations  (TBD)      Frequency and Duration min 2x/week  2 weeks       Prognosis Prognosis for Safe Diet Advancement: Good Barriers to Reach Goals: Cognitive deficits      Swallow Study   General Date of Onset: 12/22/20 HPI: Pt is an 80 y.o. female who presented with AMS and admitted for CAP. CXR 8/8: Interim development of airspace disease and consolidation in left thorax concerning for pneumonia. PMH: DM2, dementia, CKD, thyroid disease, HTN, HLD COVID infection Jan 2022, gout, chronic diastolic CHF Type of Study: Bedside Swallow Evaluation Previous Swallow Assessment: none Diet Prior to this Study: Dysphagia 3 (soft);Thin liquids Temperature Spikes Noted: No Respiratory Status: Nasal cannula History of Recent Intubation: No Behavior/Cognition: Lethargic/Drowsy;Cooperative Oral Cavity Assessment: Within Functional Limits Oral Care Completed by SLP: No Oral Cavity - Dentition: Missing dentition Self-Feeding Abilities: Needs assist Patient Positioning: Upright in bed;Postural control adequate for testing Baseline Vocal Quality: Normal Volitional Cough:  Cognitively unable to elicit Volitional Swallow: Unable to elicit     Oral/Motor/Sensory Function Overall Oral Motor/Sensory Function: Within functional limits (limited)   Ice Chips Ice chips: Within functional limits Presentation: Spoon   Thin Liquid Thin Liquid: Impaired Presentation: Straw Pharyngeal  Phase Impairments: Throat Clearing - Immediate    Nectar Thick Nectar Thick Liquid: Not tested   Honey Thick Honey Thick Liquid: Not tested   Puree Puree: Within functional limits Presentation: Spoon   Solid     Solid: Within functional limits Oral Phase Impairments:  (mastication prolonged, but functional)     Allison Wang, [place], [place] Office number [phone] Pager [place] 12/23/2020,2:46 PM

## 2020-12-24 ENCOUNTER — Encounter (HOSPITAL_COMMUNITY): Payer: Self-pay | Admitting: Internal Medicine

## 2020-12-24 ENCOUNTER — Inpatient Hospital Stay (HOSPITAL_COMMUNITY): Payer: Medicare PPO

## 2020-12-24 ENCOUNTER — Encounter (HOSPITAL_COMMUNITY): Payer: Self-pay | Admitting: Anesthesiology

## 2020-12-24 ENCOUNTER — Ambulatory Visit: Admit: 2020-12-24 | Payer: Medicare PPO

## 2020-12-24 ENCOUNTER — Encounter (HOSPITAL_COMMUNITY): Payer: Self-pay

## 2020-12-24 ENCOUNTER — Ambulatory Visit (HOSPITAL_COMMUNITY): Admission: RE | Admit: 2020-12-24 | Payer: Medicare PPO | Source: Ambulatory Visit

## 2020-12-24 ENCOUNTER — Encounter (HOSPITAL_COMMUNITY): Admission: EM | Disposition: E | Payer: Self-pay | Source: Home / Self Care | Attending: Family Medicine

## 2020-12-24 DIAGNOSIS — N185 Chronic kidney disease, stage 5: Secondary | ICD-10-CM | POA: Diagnosis not present

## 2020-12-24 DIAGNOSIS — G9341 Metabolic encephalopathy: Secondary | ICD-10-CM | POA: Diagnosis not present

## 2020-12-24 DIAGNOSIS — R4182 Altered mental status, unspecified: Secondary | ICD-10-CM | POA: Diagnosis not present

## 2020-12-24 DIAGNOSIS — J189 Pneumonia, unspecified organism: Secondary | ICD-10-CM | POA: Diagnosis not present

## 2020-12-24 DIAGNOSIS — Z7189 Other specified counseling: Secondary | ICD-10-CM

## 2020-12-24 DIAGNOSIS — J9601 Acute respiratory failure with hypoxia: Secondary | ICD-10-CM | POA: Diagnosis not present

## 2020-12-24 LAB — COMPREHENSIVE METABOLIC PANEL
ALT: 99 U/L — ABNORMAL HIGH (ref 0–44)
AST: 44 U/L — ABNORMAL HIGH (ref 15–41)
Albumin: 2.9 g/dL — ABNORMAL LOW (ref 3.5–5.0)
Alkaline Phosphatase: 91 U/L (ref 38–126)
Anion gap: 12 (ref 5–15)
BUN: 86 mg/dL — ABNORMAL HIGH (ref 8–23)
CO2: 25 mmol/L (ref 22–32)
Calcium: 10.3 mg/dL (ref 8.9–10.3)
Chloride: 107 mmol/L (ref 98–111)
Creatinine, Ser: 4.97 mg/dL — ABNORMAL HIGH (ref 0.44–1.00)
GFR, Estimated: 8 mL/min — ABNORMAL LOW (ref >60)
Glucose, Bld: 143 mg/dL — ABNORMAL HIGH (ref 70–99)
Potassium: 4.1 mmol/L (ref 3.5–5.1)
Sodium: 144 mmol/L (ref 135–145)
Total Bilirubin: 0.1 mg/dL — ABNORMAL LOW (ref 0.3–1.2)
Total Protein: 6.1 g/dL — ABNORMAL LOW (ref 6.5–8.1)

## 2020-12-24 LAB — PHOSPHORUS: Phosphorus: 7.3 mg/dL — ABNORMAL HIGH (ref 2.5–4.6)

## 2020-12-24 LAB — CBC WITH DIFFERENTIAL/PLATELET
Abs Immature Granulocytes: 0.05 10*3/uL (ref 0.00–0.07)
Basophils Absolute: 0 10*3/uL (ref 0.0–0.1)
Basophils Relative: 0 %
Eosinophils Absolute: 0 10*3/uL (ref 0.0–0.5)
Eosinophils Relative: 0 %
HCT: 29.7 % — ABNORMAL LOW (ref 36.0–46.0)
Hemoglobin: 8.4 g/dL — ABNORMAL LOW (ref 12.0–15.0)
Immature Granulocytes: 0 %
Lymphocytes Relative: 2 %
Lymphs Abs: 0.2 10*3/uL — ABNORMAL LOW (ref 0.7–4.0)
MCH: 24.9 pg — ABNORMAL LOW (ref 26.0–34.0)
MCHC: 28.3 g/dL — ABNORMAL LOW (ref 30.0–36.0)
MCV: 88.1 fL (ref 80.0–100.0)
Monocytes Absolute: 0.3 10*3/uL (ref 0.1–1.0)
Monocytes Relative: 3 %
Neutro Abs: 10.9 10*3/uL — ABNORMAL HIGH (ref 1.7–7.7)
Neutrophils Relative %: 95 %
Platelets: 318 10*3/uL (ref 150–400)
RBC: 3.37 MIL/uL — ABNORMAL LOW (ref 3.87–5.11)
RDW: 19.9 % — ABNORMAL HIGH (ref 11.5–15.5)
WBC: 11.5 10*3/uL — ABNORMAL HIGH (ref 4.0–10.5)
nRBC: 1.3 % — ABNORMAL HIGH (ref 0.0–0.2)

## 2020-12-24 LAB — CULTURE, BLOOD (ROUTINE X 2)

## 2020-12-24 LAB — PATHOLOGIST SMEAR REVIEW

## 2020-12-24 LAB — GLUCOSE, CAPILLARY
Glucose-Capillary: 164 mg/dL — ABNORMAL HIGH (ref 70–99)
Glucose-Capillary: 153 mg/dL — ABNORMAL HIGH (ref 70–99)
Glucose-Capillary: 248 mg/dL — ABNORMAL HIGH (ref 70–99)
Glucose-Capillary: 237 mg/dL — ABNORMAL HIGH (ref 70–99)

## 2020-12-24 LAB — MAGNESIUM: Magnesium: 2.5 mg/dL — ABNORMAL HIGH (ref 1.7–2.4)

## 2020-12-24 SURGERY — MRI WITH ANESTHESIA
Anesthesia: General

## 2020-12-24 MED ORDER — FUROSEMIDE 10 MG/ML IJ SOLN
120.0000 mg | Freq: Two times a day (BID) | INTRAVENOUS | Status: AC
  Administered 2020-12-24 (×2): 120 mg via INTRAVENOUS
  Filled 2020-12-24: qty 12
  Filled 2020-12-24: qty 10

## 2020-12-24 MED ORDER — PIPERACILLIN-TAZOBACTAM IN DEX 2-0.25 GM/50ML IV SOLN
2.2500 g | Freq: Three times a day (TID) | INTRAVENOUS | Status: DC
  Administered 2020-12-24 – 2020-12-25 (×3): 2.25 g via INTRAVENOUS
  Filled 2020-12-24 (×5): qty 50

## 2020-12-24 NOTE — Care Management Important Message (Signed)
Important Message  Patient Details  Name: Allison Wang. [place] MRN: [id number] Date of Birth: 04-13-1941   Medicare Important Message Given:  Yes     Allison Wang 12/23/2020, 3:01 PM

## 2020-12-24 NOTE — Progress Notes (Signed)
PROGRESS NOTE    Allison Wang. Marvel Plan  XBJ:[id number] DOB: 1941/02/25 DOA: 12/24/2020 PCP: Roselee Nova, MD   Chief Complaint  Patient presents with   Altered Mental Status   Brief Narrative:  80 y.o. female with medical history significant of covid infection Jan 2022, gout,thyroid disease, HTN, HLD, NIDDM2, CKDV, chronic dCHF,  Pituitary disease, dementia,    Admitted for  CAP acute resp failure with hypoxia, bradycardia, aki, hyperkalemia.  Assessment & Plan:   Active Problems:   CAP (community acquired pneumonia)   Bradycardia   Acute metabolic encephalopathy   DM2 (diabetes mellitus, type 2) (HCC)   CKD (chronic kidney disease), stage V (HCC)   Dementia (HCC)   Essential hypertension   Chronic diastolic CHF (congestive heart failure) (HCC)   Acute on chronic renal failure (HCC)   Elevated LFTs   Hyperkalemia   Acute respiratory failure with hypoxia (HCC)   Anemia due to chronic kidney disease   Symptomatic anemia   Altered mental status   Goals of care, counseling/discussion  Goals of care Discussed worsening clinical status with son, daughter today - noted progressive renal failure/pneumonia vs volume overload - discussed plan for aggressive diuresis, broadened abx, but unclear if she'll turn around. Appreciate palliative assistance Family considering hospice, options  Acute hypoxic respiratory failure  Community Acquired Pneumonia  Volume Overload Currently requiring 15 L CXR with bibasilar airspace disease L>R CXR with progressive multilobar bilateral pneumonia CXR 8/12 Broaden to zosyn, continue azithromycin for now Trial of high dose lasix, follow Blood cultures positive, likely contaminant, see below Negative MRSA PCR, follow urine strep, urine leginella Sputum cx if possible SLP eval  - dysphagia 2, thin liquid Appears overloaded, trial lasix and follow response  AKI on CKD 5  Volume Overload -renal function worsening today -thought due to  ischemic/prerenal insults in setting of pneumonia  -renal notes poor dialysis candiate - signed off, recommending supportive measures - follow with diuresis with overload  Positive Blood Cultures Suspect contaminants, follow repeat cultures (NG at this time)  Acute metabolic encephalopathy with baseline dementia -does not recognize family members at baseline.   -daughter reports patient is more sleepy than normal -could be from pneumonia and aki -delirium precautions, w/u additionally as needed   Elevation of LFT -CK unremarkable, right upper quadrant ultrasound no acute findings -Seen by GI, thought LFT elevation due to infection, no plan for ERCP -GI recommend treat underlying pneumonia, trend LFT   Acute on chronic anemia -Seen by GI do not think she is Chrishawn Kring candidate for EGD colonoscopy -Likely component of anemia of chronic disease -She received 1 unit of PRBC, hemoglobin increased from 7.4-9.2 -Monitor CBC   Sinus bradycardia/elevation of troponin Seen by cardiology Thought due to AKI hyperkalemia Troponin elevation due to demand ischemia, can follow outpatient for ischemic work-up if patient and family wishes Avoid AV nodal blocking agent, cardiology recommend discontinue aricept, cardiology recommend palliative care consult    Chronic diastolic CHF Seen by cardiology Patient not Clyde Zarrella candidate for beta-blocker due to bradycardia Okay to resume Lasix 20 mg daily once infection resolves (trial of diuresis as noted above)   Non-insulin-dependent type 2 diabetes On DPP 4 inhibitor at home Currently on ssi  With hypoglycemia due to poor oral intake, on hypoglycemic protocol   Hypothyroidism Continue Synthroid   Pituitary tumor -continue hydrocortisone (stress dose)   FTT: needs goals of care discussion, palliative care consulted   DVT prophylaxis: SCD Code Status:partial  Family Communication: son at bedside  Disposition:   Status is: Inpatient  Remains inpatient  appropriate because:Inpatient level of care appropriate due to severity of illness  Dispo: The patient is from: Home              Anticipated d/c is to:  pending              Patient currently is not medically stable to d/c.   Difficult to place patient No       Consultants:  Renal Palliative GI cardiology  Procedures:  none  Antimicrobials: Anti-infectives (From admission, onward)    Start     Dose/Rate Route Frequency Ordered Stop   12/20/2020 1400  piperacillin-tazobactam (ZOSYN) IVPB 2.25 g        2.25 g 100 mL/hr over 30 Minutes Intravenous Every 8 hours 12/27/2020 1205     12/22/20 1900  cefTRIAXone (ROCEPHIN) 2 g in sodium chloride 0.9 % 100 mL IVPB  Status:  Discontinued        2 g 200 mL/hr over 30 Minutes Intravenous Every 24 hours 01/11/2021 2001 01/10/2021 1133   12/20/2020 1645  cefTRIAXone (ROCEPHIN) 1 g in sodium chloride 0.9 % 100 mL IVPB        1 g 200 mL/hr over 30 Minutes Intravenous  Once 12/29/2020 1632 01/06/2021 1837   12/25/2020 1645  azithromycin (ZITHROMAX) 500 mg in sodium chloride 0.9 % 250 mL IVPB        500 mg 250 mL/hr over 60 Minutes Intravenous Every 24 hours 12/20/2020 1632            Subjective: Lethargic again today, doesn't say much to me  Objective: Vitals:   12/23/2020 0828 01/13/2021 1241 01/09/2021 1716 01/02/2021 2000  BP: (!) 148/78 (!) 108/46 (!) 147/50 (!) 143/48  Pulse: 60 (!) 53 (!) 53 (!) 57  Resp: 16 18 20 19   Temp: 97.6 F (36.4 C) 97.7 F (36.5 C) 97.8 F (36.6 C) (!) 97.5 F (36.4 C)  TempSrc: Axillary Axillary Axillary Axillary  SpO2: 97% 98% 100% 100%  Weight:      Height:        Intake/Output Summary (Last 24 hours) at 12/27/2020 2058 Last data filed at 01/07/2021 0828 Gross per 24 hour  Intake 200 ml  Output 300 ml  Net -100 ml   Filed Weights   01/02/2021 1555  Weight: 68.9 kg    Examination:  General: No acute distress. Cardiovascular: RRR Lungs: unlabored Abdomen: Soft, nontender, nondistended  Neurological:  alert, but lethargic. Moves all extremities 4 . Cranial nerves II through XII grossly intact. Extremities: anasarca, upper>lower extremities     Data Reviewed: I have personally reviewed following labs and imaging studies  CBC: Recent Labs  Lab 01/05/2021 2046 12/17/2020 2050 12/22/20 0800 12/18/2020 0219  WBC  --  9.2 9.6 11.5*  NEUTROABS  --  8.2* 9.1* 10.9*  HGB 8.8* 7.4* 9.2* 8.4*  HCT 26.0* 25.6* 32.2* 29.7*  MCV  --  87.4 87.5 88.1  PLT  --  308 330 323    Basic Metabolic Panel: Recent Labs  Lab 01/06/2021 1700 01/13/2021 2046 01/07/2021 2049 12/22/20 0800 12/23/20 0210 12/23/2020 0219  NA 141 145 142 145 145 144  K 6.1* 4.2 4.1 4.3 4.6 4.1  CL 106  --  108 109 109 107  CO2 23  --  24 24 20* 25  GLUCOSE 180*  --  131* 135* 115* 143*  BUN 62*  --  63* 65* 73* 86*  CREATININE 4.13*  --  4.16* 4.26* 4.63* 4.97*  CALCIUM 9.9  --  9.8 10.0 10.3 10.3  MG  --   --  2.4 2.5*  --  2.5*  PHOS  --   --  4.7* 5.6*  --  7.3*    GFR: Estimated Creatinine Clearance: 8.4 mL/min (Sanjana Folz) (by C-G formula based on SCr of 4.97 mg/dL (H)).  Liver Function Tests: Recent Labs  Lab 12/20/2020 1700 01/07/2021 2049 12/22/20 0800 12/23/20 0210 01/06/2021 0219  AST 471* 327* 218* 88* 44*  ALT 263* 234* 219* 144* 99*  ALKPHOS 130* 115 131* 106 91  BILITOT 1.1 0.4 0.7 0.7 0.1*  PROT 5.5* 6.0* 6.7 6.3* 6.1*  ALBUMIN 3.1* 2.9* 3.4* 3.0* 2.9*    CBG: Recent Labs  Lab 12/23/20 2353 01/04/2021 0831 12/22/2020 1244 01/04/2021 1724 12/31/2020 2032  GLUCAP 148* 164* 153* 248* 237*     Recent Results (from the past 240 hour(s))  Resp Panel by RT-PCR (Flu Everlean Bucher&B, Covid) Nasopharyngeal Swab     Status: None   Collection Time: 12/20/2020  4:15 PM   Specimen: Nasopharyngeal Swab; Nasopharyngeal(NP) swabs in vial transport medium  Result Value Ref Range Status   SARS Coronavirus 2 by RT PCR NEGATIVE NEGATIVE Final    Comment: (NOTE) SARS-CoV-2 target nucleic acids are NOT DETECTED.  The SARS-CoV-2 RNA is  generally detectable in upper respiratory specimens during the acute phase of infection. The lowest concentration of SARS-CoV-2 viral copies this assay can detect is 138 copies/mL. Carollynn Pennywell negative result does not preclude SARS-Cov-2 infection and should not be used as the sole basis for treatment or other patient management decisions. Emelee Rodocker negative result may occur with  improper specimen collection/handling, submission of specimen other than nasopharyngeal swab, presence of viral mutation(s) within the areas targeted by this assay, and inadequate number of viral copies(<138 copies/mL). Jashayla Glatfelter negative result must be combined with clinical observations, patient history, and epidemiological information. The expected result is Negative.  Fact Sheet for Patients:  EntrepreneurPulse.com.au  Fact Sheet for Healthcare Providers:  IncredibleEmployment.be  This test is no t yet approved or cleared by the [place] FDA and  has been authorized for detection and/or diagnosis of SARS-CoV-2 by FDA under an Emergency Use Authorization (EUA). This EUA will remain  in effect (meaning this test can be used) for the duration of the COVID-19 declaration under Section 564(b)(1) of the Act, 21 U.S.C.section 360bbb-3(b)(1), unless the authorization is terminated  or revoked sooner.       Influenza Aashka Salomone by PCR NEGATIVE NEGATIVE Final   Influenza B by PCR NEGATIVE NEGATIVE Final    Comment: (NOTE) The Xpert Xpress SARS-CoV-2/FLU/RSV plus assay is intended as an aid in the diagnosis of influenza from Nasopharyngeal swab specimens and should not be used as Khaliyah Northrop sole basis for treatment. Nasal washings and aspirates are unacceptable for Xpert Xpress SARS-CoV-2/FLU/RSV testing.  Fact Sheet for Patients: EntrepreneurPulse.com.au  Fact Sheet for Healthcare Providers: IncredibleEmployment.be  This test is not yet approved or cleared by the [place] FDA and has been authorized for detection and/or diagnosis of SARS-CoV-2 by FDA under an Emergency Use Authorization (EUA). This EUA will remain in effect (meaning this test can be used) for the duration of the COVID-19 declaration under Section 564(b)(1) of the Act, 21 U.S.C. section 360bbb-3(b)(1), unless the authorization is terminated or revoked.  Performed at [facility], [place] [address], [place], [place] [address]   Culture, blood (routine x 2)     Status: Abnormal   Collection  Time: 01/02/2021  5:30 PM   Specimen: BLOOD  Result Value Ref Range Status   Specimen Description BLOOD RIGHT ANTECUBITAL  Final   Special Requests   Final    BOTTLES DRAWN AEROBIC AND ANAEROBIC Blood Culture results may not be optimal due to an inadequate volume of blood received in culture bottles   Culture  Setup Time   Final    GRAM POSITIVE COCCI IN BOTH AEROBIC AND ANAEROBIC BOTTLES CRITICAL RESULT CALLED TO, READ BACK BY AND VERIFIED WITH: PHARMD Natiya Seelinger.LAWLES AT 7106 ON 12/22/2020 BY T.SAAD.    Culture (Drea Jurewicz)  Final    STAPHYLOCOCCUS EPIDERMIDIS THE SIGNIFICANCE OF ISOLATING THIS ORGANISM FROM Dejan Angert SINGLE SET OF BLOOD CULTURES WHEN MULTIPLE SETS ARE DRAWN IS UNCERTAIN. PLEASE NOTIFY THE MICROBIOLOGY DEPARTMENT WITHIN ONE WEEK IF SPECIATION AND SENSITIVITIES ARE REQUIRED. Performed at [facility], [place] [address] [place], [place] [address]    Report Status 12/31/2020 FINAL  Final  Culture, blood (routine x 2)     Status: Abnormal (Preliminary result)   Collection Time: 12/23/2020  5:30 PM   Specimen: BLOOD  Result Value Ref Range Status   Specimen Description BLOOD RIGHT ANTECUBITAL  Final   Special Requests   Final    BOTTLES DRAWN AEROBIC AND ANAEROBIC Blood Culture results may not be optimal due to an inadequate volume of blood received in culture bottles   Culture  Setup Time   Final    GRAM POSITIVE COCCI IN BOTH AEROBIC AND ANAEROBIC BOTTLES CRITICAL VALUE NOTED.  VALUE IS  CONSISTENT WITH PREVIOUSLY REPORTED AND CALLED VALUE.    Culture (Tamikka Pilger)  Final    STAPHYLOCOCCUS SIMULANS THE SIGNIFICANCE OF ISOLATING THIS ORGANISM FROM Aila Terra SINGLE SET OF BLOOD CULTURES WHEN MULTIPLE SETS ARE DRAWN IS UNCERTAIN. PLEASE NOTIFY THE MICROBIOLOGY DEPARTMENT WITHIN ONE WEEK IF SPECIATION AND SENSITIVITIES ARE REQUIRED. Performed at [facility], [place] [address] [place], [place] [address]    Report Status PENDING  Incomplete  Blood Culture ID Panel (Reflexed)     Status: Abnormal   Collection Time: 12/20/2020  5:30 PM  Result Value Ref Range Status   Enterococcus faecalis NOT DETECTED NOT DETECTED Final   Enterococcus Faecium NOT DETECTED NOT DETECTED Final   Listeria monocytogenes NOT DETECTED NOT DETECTED Final   Staphylococcus species DETECTED (Jovonda Selner) NOT DETECTED Final    Comment: CRITICAL RESULT CALLED TO, READ BACK BY AND VERIFIED WITH: PHARMD Candiace West.LAWLES AT 1041 ON 12/22/2020 BY T.SAAD.    Staphylococcus aureus (BCID) NOT DETECTED NOT DETECTED Final   Staphylococcus epidermidis DETECTED (Melroy Bougher) NOT DETECTED Final    Comment: CRITICAL RESULT CALLED TO, READ BACK BY AND VERIFIED WITH: PHARMD Taraoluwa Thakur.LAWLES AT 1041 ON 12/22/2020 BY T.SAAD.    Staphylococcus lugdunensis NOT DETECTED NOT DETECTED Final   Streptococcus species NOT DETECTED NOT DETECTED Final   Streptococcus agalactiae NOT DETECTED NOT DETECTED Final   Streptococcus pneumoniae NOT DETECTED NOT DETECTED Final   Streptococcus pyogenes NOT DETECTED NOT DETECTED Final   Froilan Mclean.calcoaceticus-baumannii NOT DETECTED NOT DETECTED Final   Bacteroides fragilis NOT DETECTED NOT DETECTED Final   Enterobacterales NOT DETECTED NOT DETECTED Final   Enterobacter cloacae complex NOT DETECTED NOT DETECTED Final   Escherichia coli NOT DETECTED NOT DETECTED Final   Klebsiella aerogenes NOT DETECTED NOT DETECTED Final   Klebsiella oxytoca NOT DETECTED NOT DETECTED Final   Klebsiella pneumoniae NOT DETECTED NOT DETECTED Final   Proteus species  NOT DETECTED NOT DETECTED Final   Salmonella species NOT DETECTED NOT  DETECTED Final   Serratia marcescens NOT DETECTED NOT DETECTED Final   Haemophilus influenzae NOT DETECTED NOT DETECTED Final   Neisseria meningitidis NOT DETECTED NOT DETECTED Final   Pseudomonas aeruginosa NOT DETECTED NOT DETECTED Final   Stenotrophomonas maltophilia NOT DETECTED NOT DETECTED Final   Candida albicans NOT DETECTED NOT DETECTED Final   Candida auris NOT DETECTED NOT DETECTED Final   Candida glabrata NOT DETECTED NOT DETECTED Final   Candida krusei NOT DETECTED NOT DETECTED Final   Candida parapsilosis NOT DETECTED NOT DETECTED Final   Candida tropicalis NOT DETECTED NOT DETECTED Final   Cryptococcus neoformans/gattii NOT DETECTED NOT DETECTED Final   Methicillin resistance mecA/C NOT DETECTED NOT DETECTED Final    Comment: Performed at [facility], [place] [address] [place], [place] [address]  Urine Culture     Status: None   Collection Time: 12/15/2020  9:46 PM   Specimen: Urine, Catheterized  Result Value Ref Range Status   Specimen Description URINE, CATHETERIZED  Final   Special Requests NONE  Final   Culture   Final    NO GROWTH Performed at [place] [address], [place], [place] [address]    Report Status 12/23/2020 FINAL  Final  MRSA Next Gen by PCR, Nasal     Status: None   Collection Time: 12/14/2020  9:59 PM  Result Value Ref Range Status   MRSA by PCR Next Gen NOT DETECTED NOT DETECTED Final    Comment: (NOTE) The GeneXpert MRSA Assay (FDA approved for NASAL specimens only), is one component of Atthew Coutant comprehensive MRSA colonization surveillance program. It is not intended to diagnose MRSA infection nor to guide or monitor treatment for MRSA infections. Test performance is not FDA approved in patients less than 45 years old. Performed at [facility], [place] [address], [place], [place] [address]   Culture, blood (routine x 2)     Status: None (Preliminary result)    Collection Time: 12/23/20 10:30 PM   Specimen: BLOOD  Result Value Ref Range Status   Specimen Description BLOOD RIGHT HAND  Final   Special Requests   Final    BOTTLES DRAWN AEROBIC ONLY Blood Culture results may not be optimal due to an inadequate volume of blood received in culture bottles   Culture   Final    NO GROWTH < 12 HOURS Performed at [facility], [place] [address] [place], [place] [address]    Report Status PENDING  Incomplete  Culture, blood (routine x 2)     Status: None (Preliminary result)   Collection Time: 12/23/20 10:33 PM   Specimen: BLOOD  Result Value Ref Range Status   Specimen Description BLOOD LOWER RIGHT HAND  Final   Special Requests   Final    BOTTLES DRAWN AEROBIC ONLY Blood Culture adequate volume   Culture   Final    NO GROWTH < 12 HOURS Performed at [facility], [place] [address], [place], [place] [address]    Report Status PENDING  Incomplete         Radiology Studies: DG CHEST PORT 1 VIEW  Result Date: 12/25/2020 CLINICAL DATA:  80 year old female with history of altered mental status. Shortness of breath. EXAM: PORTABLE CHEST 1 VIEW COMPARISON:  Chest x-ray 12/23/2020. FINDINGS: Worsening patchy ill-defined airspace consolidation and areas of interstitial prominence in the lungs bilaterally, particularly worsened in the left upper lobe, compatible with progressive multilobar bilateral pneumonia. Possible small left pleural effusion. No  definite right pleural effusion. No pneumothorax. Pulmonary vasculature does not appearing origin. Heart size is moderately enlarged. Upper mediastinal contours are within normal limits. Atherosclerotic calcifications in the thoracic aorta. IMPRESSION: 1. Progressive multilobar bilateral pneumonia, significantly worsened, particularly in the left upper lobe. 2. Moderate cardiomegaly. 3. Aortic atherosclerosis. Electronically Signed   By: Vinnie Langton M.D.   On: 12/29/2020 10:57   DG CHEST PORT 1  VIEW  Result Date: 12/23/2020 CLINICAL DATA:  Hypoxia EXAM: PORTABLE CHEST 1 VIEW COMPARISON:  12/29/2020 FINDINGS: Left lower lobe consolidation unchanged. Mild right lower lobe airspace disease unchanged. Negative for pulmonary edema. No significant effusion. IMPRESSION: No interval change. Bibasilar airspace disease left greater than right unchanged. Electronically Signed   By: Franchot Gallo M.D.   On: 12/23/2020 15:55        Scheduled Meds:  sodium chloride   Intravenous Once   brimonidine  1 drop Both Eyes q morning   hydrocortisone sod succinate (SOLU-CORTEF) inj  100 mg Intravenous Q8H   insulin aspart  0-9 Units Subcutaneous Q4H   latanoprost  1 drop Both Eyes QHS   levothyroxine  50 mcg Oral QAC breakfast   polyethylene glycol  17 g Oral BID   Continuous Infusions:  azithromycin 500 mg (01/10/2021 1748)   piperacillin-tazobactam (ZOSYN)  IV 2.25 g (12/15/2020 1422)     LOS: 3 days    Time spent: over 30 min    Fayrene Helper, MD [facility]   To contact the attending provider between 7A-7P or the covering provider during after hours 7P-7A, please log into the web site [email or web address] and access using universal [place] password for that web site. If you do not have the password, please call the hospital operator.  12/23/2020, 8:58 PM

## 2020-12-24 NOTE — Progress Notes (Signed)
Daily Progress Note   Patient Name: Allison Wang. Marvel Plan       Date: 01/13/2021 DOB: 28-Aug-1940  Age: 80 y.o. MRN#: [id number] Attending Physician: Elodia Florence., * Primary Care Physician: Roselee Nova, MD Admit Date: 12/20/2020  Reason for Consultation/Follow-up: goals of care  Subjective: Chart reviewed. Assessed patient at the bedside and met with her son Monica Martinez. Patient is lethargic, does not respond to my voice.   Created space and opportunity for Glen's thoughts and feelings on patient's current illness. He shares that he appreciated the update he received from Dr. Florene Glen this morning and the five children were able to discuss together via telephone this morning. They are currently leaning towards home with hospice, although Flo is also looking into the [facility] program. Monica Martinez states that end of life topics "didn't come up a lot" and they are still working through the decision together. He reflects on his previous experience with his mother-in-law passing peacefully at his home with hospice support, noting that this was a while ago and he is uncertain if the services have changed. Outpatient hospice services were explained at length. He notes that he would likely be the one to take patient to his home in [place], but this is still an ongoing discussion. Provided with Hard Choices for [employer]oving People booklet for review and offered to have a conference call with patient's 5 children to discuss options together.   I then called patient's son Linton Rump to provide support. Patient's daughter Matthias Hughs is present as well. I shared my concern that Mairen is worsening today and we discussed her renal function and increased oxygen requirement. Flo becomes very emotional and wonders if this means "her time  is running out." Counseled that unfortunately this is likely an indication for a transition to comfort care. At [facility] request, I reviewed comfort care. Patient would no longer receive aggressive medical interventions such as continuous vital signs, lab work, radiology testing, or medications not focused on comfort. All care would focus on how the patient is looking and feeling. This would include management of any symptoms that may cause discomfort, pain, shortness of breath, cough, nausea, agitation, anxiety, and/or secretions etc. Symptoms would be managed with medications and other non-pharmacological interventions such as spiritual support if requested, repositioning, music therapy, or therapeutic listening. Family verbalized understanding  and appreciation.   Also reviewed outpatient hospice services, including home hospice vs residential hospice. Flo is tearful and not ready to transition to comfort care at this time, but she is open to learning more about residential hospice facilities near patient's hometown in [place], [place].     Questions and concerns addressed. PMT will continue to support holistically.  Length of Stay: 3     Vital Signs: BP (!) 108/46 (BP Location: Right Leg)   Pulse (!) 53   Temp 97.7 F (36.5 C) (Axillary)   Resp 18   Ht 5' 3"  (1.6 m)   Wt 68.9 kg   SpO2 98%   BMI 26.93 kg/m  SpO2: SpO2: 98 % O2 Device: O2 Device: Nasal Cannula O2 Flow Rate: O2 Flow Rate (L/min): 15 L/min       Palliative Assessment/Data: PPS 30%      Palliative Care Assessment & Plan   HPI/Patient Profile: 80 y.o. female  with past medical history of dementia, chronic diastolic CHF, DM2, CKD stage V, covid infection January 2022, gout, HTN, HLD, thyroid disease, and known pituitary disease. She presented to the emergency department  on 01/03/2021 with confusion. She was much weaker and lethargic compared to baseline. In the ED, she was found to be bradycardic and hypoxic requiring 3L  oxygen. Chest x-ray revealed left thorax consolidation concerning for pneumonia. Creatinine 4.13, lactic acid elevated, BNP 913, and high sensitivity troponin 98>105>108.  Admitted to [facility] for bradycardia and CAP.   Assessment: - community acquired pneumonia - acute hypoxic respiratory failure, increasing O2 requirements - bradycardia - AKI on CKD stage V, worsening - advanced dementia  Recommendations/Plan: Continue current supportive care, family not ready for comfort care yet Family is discussing residential hospice, home with hospice, PACE  Psychosocial and emotional support provided Ongoing support from PMT   Goals of Care and Additional Recommendations: Limitations on Scope of Treatment: No Hemodialysis  Code Status: No CPR, no intubation, no defibrillation  Prognosis:  < 6 months  Discharge Planning: To Be Determined  Care plan was discussed with Dr. Florene Glen, patient's children Beaulah Corin and Flo)   Total Time 35 minutes Prolonged Time Billed  no    Greater than 50% of this time was spent in counseling and coordinating care related to the above assessment and plan.  Dorthy Cooler, PA-C Palliative Medicine Team Team phone # [phone]  Thank you for allowing the Palliative Medicine Team to assist in the care of this patient. Please utilize secure chat with additional questions, if there is no response within 30 minutes please call the above phone number.  Palliative Medicine Team providers are available by phone from 7am to 7pm daily and can be reached through the team cell phone.  Should this patient require assistance outside of these hours, please call the patient's attending physician.

## 2020-12-24 NOTE — Progress Notes (Signed)
  Speech Language Pathology Treatment: Dysphagia  Patient Details Name: Allison Wang MRN: [id number] DOB: [birth year] Today's Date: 12/15/2020 Time: 9735-3299 SLP Time Calculation (min) (ACUTE ONLY): 17 min  Assessment / Plan / Recommendation Clinical Impression  Prior to session, checked in with RN who reported pt pocketing her food during pm meal yesterday (12/23/09). Pt asleep upon arrival, but with verbal greeting and upright positioning obtained adequate alertness for PO intake. Son, present for session, reports assisting pt with meal this afternoon and her having difficulty masticating some dysphagia 3 textures, although she did not have her dentures in at the time and he is unsure if this would have made a difference. Trials of simulated dysphagia 2 and regular textured solids consumed (dentures placed) without difficulty. Oral prep time was appropriate and no oral residuals noted. She continues to benefit from controlled small straw sips of thin liquids, otherwise she is impulsive and exhibits immediate coughing after rapid intake. Recommend dysphagia 2 diet with thin liquids, given RN and family reports of fluctuating difficulty with more advanced solids. SLP to f/u.   HPI HPI: Pt is an 80 y.o. female who presented with AMS and admitted for CAP. CXR 8/8: Interim development of airspace disease and consolidation in left thorax concerning for pneumonia. PMH: DM2, dementia, CKD, thyroid disease, HTN, HLD COVID infection Jan 2022, gout, chronic diastolic CHF      SLP Plan  Continue with current plan of care       Recommendations  Diet recommendations: Dysphagia 2 (fine chop);Thin liquid Liquids provided via: Cup;Straw Medication Administration: Whole meds with liquid Supervision: Trained caregiver to feed patient Compensations: Minimize environmental distractions;Slow rate;Small sips/bites Postural Changes and/or Swallow Maneuvers: Seated upright 90 degrees                 Oral Care Recommendations: Oral care BID;Staff/trained caregiver to provide oral care Follow up Recommendations: Skilled Nursing facility SLP Visit Diagnosis: Dysphagia, unspecified (R13.10) Plan: Continue with current plan of care       [place], [place], [place] Office Number: [phone]   Acie Fredrickson 12/16/2020, 2:55 PM

## 2020-12-24 NOTE — Progress Notes (Signed)
Pharmacy Antibiotic Note  Allison Wang is a 80 y.o. female admitted on 12/28/2020 with pneumonia.  Pharmacy has been consulted for Zosyn dosing.  Patient was initially admitted for CAP acute resp failure with hypoxia and was started on ceftriaxone/azithromycin. While on mentioned therapy, continued to have worsening hypoxia (now on 15L O2) and worsening imaging (8/11 CXR: Worsening consolidation and areas of interstitial prominence in the lungs bilaterally, particularly worsened in the left upper lobe).   Will now escalate ceftriaxone to Zosyn.  Plan: Zosyn 2.25g IV q8h.  Height: 5\' 3"  (160 cm) Weight: 68.9 kg (152 lb) IBW/kg (Calculated) : 52.4  Temp (24hrs), Avg:97.4 F (36.3 C), Min:96.2 F (35.7 C), Max:98.9 F (37.2 C)  Recent Labs  Lab 01/03/2021 1631 01/13/2021 1700 01/10/2021 1730 01/07/2021 2000 12/20/2020 2049 12/20/2020 2050 12/22/20 0800 12/23/20 0210 01/01/2021 0219  WBC  --   --   --   --   --  9.2 9.6  --  11.5*  CREATININE  --  4.13*  --   --  4.16*  --  4.26* 4.63* 4.97*  LATICACIDVEN 2.0*  --  2.5* 1.7  --   --   --   --   --     Estimated Creatinine Clearance: 8.4 mL/min (A) (by C-G formula based on SCr of 4.97 mg/dL (H)).    Allergies  Allergen Reactions   Lisinopril Swelling    Facial/lip swelling     Antimicrobials this admission: Azithromycin IV 8/08 >>  Ceftriaxone 8/08 >> 8/11  Dose adjustments this admission: N/A  Microbiology results: 8/08 BCx: staph epidermidis 8/08 UCx: neg    Thank you for allowing pharmacy to be a part of this patient's care.  Ardyth Harps, PharmD Clinical Pharmacist

## 2020-12-24 NOTE — Progress Notes (Signed)
   12/25/2020 1241  Assess: MEWS Score  Temp 97.7 F (36.5 C)  BP (!) 108/46  Pulse Rate (!) 53  ECG Heart Rate (!) 52  Resp 18  SpO2 98 %  Assess: MEWS Score  MEWS Temp 0  MEWS Systolic 0  MEWS Pulse 0  MEWS RR 0  MEWS LOC 2  MEWS Score 2  MEWS Score Color Yellow  Assess: if the MEWS score is Yellow or Red  Were vital signs taken at a resting state? Yes  Focused Assessment No change from prior assessment  Early Detection of Sepsis Score *See Row Information* High  MEWS guidelines implemented *See Row Information* Yes  Treat  MEWS Interventions Escalated (See documentation below)  Take Vital Signs  Increase Vital Sign Frequency  Yellow: Q 2hr X 2 then Q 4hr X 2, if remains yellow, continue Q 4hrs  Escalate  MEWS: Escalate Yellow: discuss with charge nurse/RN and consider discussing with provider and RRT  Notify: Charge Nurse/RN  Name of Charge Nurse/RN Notified erin  Date Charge Nurse/RN Notified 12/28/2020  Time Charge Nurse/RN Notified 1245  Document  Progress note created (see row info) Yes

## 2020-12-25 ENCOUNTER — Inpatient Hospital Stay (HOSPITAL_COMMUNITY): Payer: Medicare PPO

## 2020-12-25 DIAGNOSIS — R0902 Hypoxemia: Secondary | ICD-10-CM | POA: Diagnosis not present

## 2020-12-25 DIAGNOSIS — R0602 Shortness of breath: Secondary | ICD-10-CM

## 2020-12-25 LAB — CBC WITH DIFFERENTIAL/PLATELET
Abs Immature Granulocytes: 0 10*3/uL (ref 0.00–0.07)
Abs Immature Granulocytes: 0.06 10*3/uL (ref 0.00–0.07)
Basophils Absolute: 0 10*3/uL (ref 0.0–0.1)
Basophils Absolute: 0 10*3/uL (ref 0.0–0.1)
Basophils Relative: 0 %
Basophils Relative: 0 %
Eosinophils Absolute: 0 10*3/uL (ref 0.0–0.5)
Eosinophils Absolute: 0 10*3/uL (ref 0.0–0.5)
Eosinophils Relative: 0 %
Eosinophils Relative: 0 %
HCT: 29.6 % — ABNORMAL LOW (ref 36.0–46.0)
HCT: 29.3 % — ABNORMAL LOW (ref 36.0–46.0)
Hemoglobin: 8.7 g/dL — ABNORMAL LOW (ref 12.0–15.0)
Hemoglobin: 8.4 g/dL — ABNORMAL LOW (ref 12.0–15.0)
Immature Granulocytes: 1 %
Lymphocytes Relative: 2 %
Lymphocytes Relative: 1 %
Lymphs Abs: 0.3 10*3/uL — ABNORMAL LOW (ref 0.7–4.0)
Lymphs Abs: 0.1 10*3/uL — ABNORMAL LOW (ref 0.7–4.0)
MCH: 25.4 pg — ABNORMAL LOW (ref 26.0–34.0)
MCH: 24.8 pg — ABNORMAL LOW (ref 26.0–34.0)
MCHC: 29.4 g/dL — ABNORMAL LOW (ref 30.0–36.0)
MCHC: 28.7 g/dL — ABNORMAL LOW (ref 30.0–36.0)
MCV: 86.3 fL (ref 80.0–100.0)
MCV: 86.4 fL (ref 80.0–100.0)
Monocytes Absolute: 0.3 10*3/uL (ref 0.1–1.0)
Monocytes Absolute: 0.2 10*3/uL (ref 0.1–1.0)
Monocytes Relative: 2 %
Monocytes Relative: 2 %
Neutro Abs: 12.2 10*3/uL — ABNORMAL HIGH (ref 1.7–7.7)
Neutro Abs: 10.7 10*3/uL — ABNORMAL HIGH (ref 1.7–7.7)
Neutrophils Relative %: 96 %
Neutrophils Relative %: 96 %
Platelets: 326 10*3/uL (ref 150–400)
Platelets: 281 10*3/uL (ref 150–400)
RBC: 3.43 MIL/uL — ABNORMAL LOW (ref 3.87–5.11)
RBC: 3.39 MIL/uL — ABNORMAL LOW (ref 3.87–5.11)
RDW: 19.4 % — ABNORMAL HIGH (ref 11.5–15.5)
RDW: 19.5 % — ABNORMAL HIGH (ref 11.5–15.5)
WBC: 12.7 10*3/uL — ABNORMAL HIGH (ref 4.0–10.5)
WBC: 11.1 10*3/uL — ABNORMAL HIGH (ref 4.0–10.5)
nRBC: 3 /100 WBC — ABNORMAL HIGH
nRBC: 3.5 % — ABNORMAL HIGH (ref 0.0–0.2)
nRBC: 2.9 % — ABNORMAL HIGH (ref 0.0–0.2)

## 2020-12-25 LAB — COMPREHENSIVE METABOLIC PANEL
ALT: 86 U/L — ABNORMAL HIGH (ref 0–44)
ALT: 75 U/L — ABNORMAL HIGH (ref 0–44)
AST: 44 U/L — ABNORMAL HIGH (ref 15–41)
AST: 37 U/L (ref 15–41)
Albumin: 2.8 g/dL — ABNORMAL LOW (ref 3.5–5.0)
Albumin: 2.7 g/dL — ABNORMAL LOW (ref 3.5–5.0)
Alkaline Phosphatase: 100 U/L (ref 38–126)
Alkaline Phosphatase: 85 U/L (ref 38–126)
Anion gap: 11 (ref 5–15)
Anion gap: 15 (ref 5–15)
BUN: 92 mg/dL — ABNORMAL HIGH (ref 8–23)
BUN: 96 mg/dL — ABNORMAL HIGH (ref 8–23)
CO2: 22 mmol/L (ref 22–32)
CO2: 19 mmol/L — ABNORMAL LOW (ref 22–32)
Calcium: 9.9 mg/dL (ref 8.9–10.3)
Calcium: 9.9 mg/dL (ref 8.9–10.3)
Chloride: 102 mmol/L (ref 98–111)
Chloride: 105 mmol/L (ref 98–111)
Creatinine, Ser: 5.08 mg/dL — ABNORMAL HIGH (ref 0.44–1.00)
Creatinine, Ser: 5.11 mg/dL — ABNORMAL HIGH (ref 0.44–1.00)
GFR, Estimated: 8 mL/min — ABNORMAL LOW (ref >60)
GFR, Estimated: 8 mL/min — ABNORMAL LOW (ref >60)
Glucose, Bld: 181 mg/dL — ABNORMAL HIGH (ref 70–99)
Glucose, Bld: 67 mg/dL — ABNORMAL LOW (ref 70–99)
Potassium: 3.7 mmol/L (ref 3.5–5.1)
Potassium: 4.1 mmol/L (ref 3.5–5.1)
Sodium: 135 mmol/L (ref 135–145)
Sodium: 139 mmol/L (ref 135–145)
Total Bilirubin: 0.1 mg/dL — ABNORMAL LOW (ref 0.3–1.2)
Total Bilirubin: <0.1 mg/dL — ABNORMAL LOW (ref 0.3–1.2)
Total Protein: 5.9 g/dL — ABNORMAL LOW (ref 6.5–8.1)
Total Protein: 5.8 g/dL — ABNORMAL LOW (ref 6.5–8.1)

## 2020-12-25 LAB — GLUCOSE, CAPILLARY
Glucose-Capillary: 180 mg/dL — ABNORMAL HIGH (ref 70–99)
Glucose-Capillary: 105 mg/dL — ABNORMAL HIGH (ref 70–99)
Glucose-Capillary: 83 mg/dL (ref 70–99)
Glucose-Capillary: 158 mg/dL — ABNORMAL HIGH (ref 70–99)

## 2020-12-25 LAB — CULTURE, BLOOD (ROUTINE X 2)

## 2020-12-25 LAB — MAGNESIUM: Magnesium: 2.3 mg/dL (ref 1.7–2.4)

## 2020-12-25 LAB — PHOSPHORUS: Phosphorus: 7.7 mg/dL — ABNORMAL HIGH (ref 2.5–4.6)

## 2020-12-25 MED ORDER — HALOPERIDOL 0.5 MG PO TABS
0.5000 mg | ORAL_TABLET | ORAL | Status: DC | PRN
Start: 2020-12-25 — End: 2020-12-25
  Filled 2020-12-25: qty 1

## 2020-12-25 MED ORDER — ONDANSETRON HCL 4 MG/2ML IJ SOLN: 4.0000 mg | Freq: Four times a day (QID) | INTRAMUSCULAR | Status: DC | PRN

## 2020-12-25 MED ORDER — ONDANSETRON 4 MG PO TBDP: 4.0000 mg | ORAL_TABLET | Freq: Four times a day (QID) | ORAL | Status: DC | PRN

## 2020-12-25 MED ORDER — GLYCOPYRROLATE 1 MG PO TABS
1.0000 mg | ORAL_TABLET | ORAL | Status: DC | PRN
  Filled 2020-12-25: qty 1

## 2020-12-25 MED ORDER — HALOPERIDOL LACTATE 5 MG/ML IJ SOLN: 0.5000 mg | INTRAMUSCULAR | Status: DC | PRN

## 2020-12-25 MED ORDER — ACETAMINOPHEN 650 MG RE SUPP: 650.0000 mg | Freq: Four times a day (QID) | RECTAL | Status: DC | PRN

## 2020-12-25 MED ORDER — MORPHINE SULFATE (PF) 2 MG/ML IV SOLN: 1.0000 mg | INTRAVENOUS | Status: DC | PRN

## 2020-12-25 MED ORDER — ONDANSETRON 4 MG PO TBDP
4.0000 mg | ORAL_TABLET | Freq: Four times a day (QID) | ORAL | Status: DC | PRN
Start: 2020-12-25 — End: 2020-12-25

## 2020-12-25 MED ORDER — GLYCOPYRROLATE 0.2 MG/ML IJ SOLN
0.2000 mg | INTRAMUSCULAR | Status: DC | PRN
Start: 2020-12-25 — End: 2020-12-25

## 2020-12-25 MED ORDER — BIOTENE DRY MOUTH MT LIQD
15.0000 mL | OROMUCOSAL | Status: DC | PRN
Start: 2020-12-25 — End: 2020-12-25

## 2020-12-25 MED ORDER — ACETAMINOPHEN 325 MG PO TABS: 650.0000 mg | ORAL_TABLET | Freq: Four times a day (QID) | ORAL | Status: DC | PRN

## 2020-12-25 MED ORDER — BIOTENE DRY MOUTH MT LIQD: 15.0000 mL | OROMUCOSAL | Status: DC | PRN

## 2020-12-25 MED ORDER — GLYCOPYRROLATE 0.2 MG/ML IJ SOLN: 0.2000 mg | INTRAMUSCULAR | Status: DC | PRN

## 2020-12-25 MED ORDER — POLYVINYL ALCOHOL 1.4 % OP SOLN
1.0000 [drp] | Freq: Four times a day (QID) | OPHTHALMIC | Status: DC | PRN
  Filled 2020-12-25: qty 15

## 2020-12-25 MED ORDER — SODIUM CHLORIDE 0.9 % IV SOLN
0.5000 mg/h | INTRAVENOUS | Status: DC
  Administered 2020-12-25: 0.5 mg/h via INTRAVENOUS
  Filled 2020-12-25: qty 5

## 2020-12-25 MED ORDER — HALOPERIDOL 0.5 MG PO TABS
0.5000 mg | ORAL_TABLET | ORAL | Status: DC | PRN
  Filled 2020-12-25: qty 1

## 2020-12-25 MED ORDER — HALOPERIDOL LACTATE 2 MG/ML PO CONC
0.5000 mg | ORAL | Status: DC | PRN
  Filled 2020-12-25: qty 0.3

## 2020-12-25 MED ORDER — POLYVINYL ALCOHOL 1.4 % OP SOLN: 1.0000 [drp] | Freq: Four times a day (QID) | OPHTHALMIC | Status: DC | PRN

## 2020-12-25 MED ORDER — HYDROMORPHONE BOLUS VIA INFUSION
0.2000 mg | INTRAVENOUS | Status: DC | PRN
  Filled 2020-12-25: qty 1

## 2020-12-25 NOTE — Progress Notes (Signed)
PT Cancellation Note  Patient Details Name: Allison Wang. Vohs MRN: [id number] DOB: [birth year]   Cancelled Treatment:    Reason Eval/Treat Not Completed: Medical issues which prohibited therapy.  Medical issues which prohibited therapy (Pt with Sp02 in the 78s with 15L NRB. Retry when pt allows.   Ramond Dial 12/25/2020, 10:40 AM  Mee Hives, PT MS Acute Rehab Dept. Number: [place]

## 2020-12-25 NOTE — Plan of Care (Signed)
  Problem: Activity: Goal: Ability to tolerate increased activity will improve Outcome: Progressing   Problem: Clinical Measurements: Goal: Ability to maintain a body temperature in the normal range will improve Outcome: Progressing   

## 2020-12-25 NOTE — Progress Notes (Signed)
OT Cancellation Note  Patient Details Name: Allison Wang. Janicki MRN: [id number] DOB: 11-14-1940   Cancelled Treatment:    Reason Eval/Treat Not Completed: Medical issues which prohibited therapy (Pt with Sp02 in the 80s with 15L NRB.)  Malka So 12/25/2020, 10:38 AM Nestor Lewandowsky, OTR/L Acute Rehabilitation Services Pager: [phone] Office: [phone]

## 2020-12-25 NOTE — Plan of Care (Signed)
  Problem: Clinical Measurements: Goal: Ability to maintain a body temperature in the normal range will improve Outcome: Progressing   Problem: Respiratory: Goal: Ability to maintain adequate ventilation will improve Outcome: Progressing Goal: Ability to maintain a clear airway will improve Outcome: Progressing   

## 2020-12-25 NOTE — Progress Notes (Signed)
Daily Progress Note   Patient Name: Allison Wang. Allison Wang       Date: 12/25/2020 DOB: 09-01-1940  Age: 80 y.o. MRN#: [id number] Attending Physician: Elodia Florence., * Primary Care Physician: Roselee Nova, MD Admit Date: 01/11/2021  Reason for Consultation/Follow-up: goals of care  Subjective: Chart reviewed. Assessed patient at the bedside and met with several family members including her adult children and brother. She is on NRB and [place], unresponsive.   Patient's family confirms that after discussion with Dr. Florene Glen they are in agreement with a transition to comfort focused care. Acknowledged the difficulty and courage of this decision and created spaced and opportunity for family's thoughts and feelings. Educated on the process of weaning oxygen gradually while titrating dilaudid drip to treat patient's dyspnea. Emphasized the importance of addressing her comfort and respiratory distress rather than oxygen saturation. Emotional support and therapeutic listening was provided.   Questions and concerns addressed. PMT will continue to support holistically.  Length of Stay: 4     Vital Signs: BP 117/84 (BP Location: Right Leg)   Pulse (!) 51   Temp (!) 97.4 F (36.3 C) (Axillary)   Resp 15   Ht 5' 3"  (1.6 m)   Wt 68.9 kg   SpO2 97%   BMI 26.93 kg/m  SpO2: SpO2: 97 % O2 Device: O2 Device: High Flow Nasal Cannula O2 Flow Rate: O2 Flow Rate (L/min): 15 L/min       Palliative Assessment/Data: PPS 10%      Palliative Care Assessment & Wang   HPI/Patient Profile: 80 y.o. female  with past medical history of dementia, chronic diastolic CHF, DM2, CKD stage V, covid infection January 2022, gout, HTN, HLD, thyroid disease, and known pituitary disease. She presented to the  emergency department  on 12/22/2020 with confusion. She was much weaker and lethargic compared to baseline. In the ED, she was found to be bradycardic and hypoxic requiring 3L oxygen. Chest x-ray revealed left thorax consolidation concerning for pneumonia. Creatinine 4.13, lactic acid elevated, BNP 913, and high sensitivity troponin 98>105>108.  Admitted to [facility] for bradycardia and CAP.   Assessment: - end of life care  Recommendations/Wang: Transition to comfort care Agree with dilaudid ggt for dyspnea/pain/air hunger/comfort, titrate to effective rate while weaning O2 Agree with comfort medications per [facility] Unrestricted  visitations in the setting of EOL (per policy) Comfort cart for family   Goals of Care and Additional Recommendations: Limitations on Scope of Treatment: Full Comfort Care  Code Status: DNR  Prognosis:  Hours - Days  Discharge Planning: Anticipated Hospital Death  Care Wang was discussed with Dr. Florene Glen, patient's children   Total time: 25 minutes Greater than 50% of this time was spent in counseling and coordinating care related to the above assessment and Wang.  Dorthy Cooler, PA-C Palliative Medicine Team Team phone # [phone]  Thank you for allowing the Palliative Medicine Team to assist in the care of this patient. Please utilize secure chat with additional questions, if there is no response within 30 minutes please call the above phone number.  Palliative Medicine Team providers are available by phone from 7am to 7pm daily and can be reached through the team cell phone.  Should this patient require assistance outside of these hours, please call the patient's attending physician.

## 2020-12-25 NOTE — Progress Notes (Addendum)
PROGRESS NOTE    Allison Wang. Marvel Plan  GEZ:[id number] DOB: Jan 09, 1941 DOA: 12/23/2020 PCP: Roselee Nova, MD   Chief Complaint  Patient presents with   Altered Mental Status   Brief Narrative:  80 y.o. female with medical history significant of covid infection Jan 2022, gout,thyroid disease, HTN, HLD, NIDDM2, CKDV, chronic dCHF,  Pituitary disease, dementia,    Admitted for  CAP acute resp failure with hypoxia, bradycardia, aki, hyperkalemia.  Assessment & Plan:   Active Problems:   CAP (community acquired pneumonia)   Bradycardia   Acute metabolic encephalopathy   DM2 (diabetes mellitus, type 2) (HCC)   CKD (chronic kidney disease), stage V (HCC)   Dementia (HCC)   Essential hypertension   Chronic diastolic CHF (congestive heart failure) (HCC)   Acute on chronic renal failure (HCC)   Elevated LFTs   Hyperkalemia   Acute respiratory failure with hypoxia (HCC)   Anemia due to chronic kidney disease   Symptomatic anemia   Altered mental status   Goals of care, counseling/discussion  Goals of care Continued worsening of respiratory status today.  Not maintaining O2 with NRB (desatting to 80's), worsening opacities on CXR.  Received 2 doses of high dose lasix yesterday without notable improvement.  Creatinine worsened today.  With her declining, think that Elanor Cale transition to comfort is reasonable, they'd been discussing hospice, comfort with palliative.     At this time, going to have family come to bedside who want to come Will not escalate care at this time, but maintain current care for now Likely plan to transition to comfort after all family has had Pauleen Goleman chance to see her  Addendum: family arrived at bedside.  Reviewed plan of care with [name] and siblings.  Discussed clinical status and where we are, pts o2 at time of our conversation had recovered on NRB+[place], but discussed even with stabilization, I think worsening respiratory status despite lasix/broadened abx is poor sign and  comfort is reasonable plan of care.  After discussion with all family members (6+ present), they were in agreement with plan for comfot.  Comfort meds, start gtt prior to deescalating oxygen for comfort  Acute hypoxic respiratory failure  Community Acquired Pneumonia  Volume Overload Desatting on NRB this AM CXR with bibasilar airspace disease L>R CXR with progressive multilobar bilateral pneumonia CXR shows increased bilateral airspace opacities, L>R Broaden to zosyn, continue azithromycin for now S/p lasix 120 mg x2 on 8/11 (UOP not well recorded, but clinically did not meaningfully respond based on her worsening respiratory status) Blood cultures positive, likely contaminant, see below Negative MRSA PCR, follow urine strep, urine leginella Sputum cx if possible SLP eval  - dysphagia 2, thin liquid Suspect worsening respiratory status 2/2 progressive renal failure/multifocal pneumonia - as noted above, with worsening, expect to transition to comfort measures   AKI on CKD 5  Volume Overload -renal function worsening today -thought due to ischemic/prerenal insults in setting of pneumonia  -renal notes poor dialysis candiate - signed off, recommending supportive measures - follow with diuresis with overload  Positive Blood Cultures Suspect contaminants, follow repeat cultures (continued NG at this time)  Acute metabolic encephalopathy with baseline dementia -does not recognize family members at baseline.   -daughter reports patient is more sleepy than normal -could be from pneumonia and aki -delirium precautions, w/u additionally as needed   Elevation of LFT -CK unremarkable, right upper quadrant ultrasound no acute findings -Seen by GI, thought LFT elevation due to infection, no plan for  ERCP -GI recommend treat underlying pneumonia, trend LFT   Acute on chronic anemia -Seen by GI do not think she is Seiji Wiswell candidate for EGD colonoscopy -Likely component of anemia of chronic  disease -She received 1 unit of PRBC, hemoglobin increased from 7.4-9.2 -Monitor CBC   Sinus bradycardia/elevation of troponin Seen by cardiology Thought due to AKI hyperkalemia Troponin elevation due to demand ischemia, can follow outpatient for ischemic work-up if patient and family wishes Avoid AV nodal blocking agent, cardiology recommend discontinue aricept, cardiology recommend palliative care consult    Chronic diastolic CHF Seen by cardiology Patient not Everlene Cunning candidate for beta-blocker due to bradycardia Okay to resume Lasix 20 mg daily once infection resolves (trial of diuresis as noted above)   Non-insulin-dependent type 2 diabetes On DPP 4 inhibitor at home Currently on ssi  With hypoglycemia due to poor oral intake, on hypoglycemic protocol   Hypothyroidism Continue Synthroid   Pituitary tumor -continue hydrocortisone (stress dose)   FTT: needs goals of care discussion, palliative care consulted   DVT prophylaxis: SCD Code Status:partial  Family Communication: son at bedside, daughter HCPOA over phone Disposition:   Status is: Inpatient  Remains inpatient appropriate because:Inpatient level of care appropriate due to severity of illness  Dispo: The patient is from: Home              Anticipated d/c is to:  pending              Patient currently is not medically stable to d/c.   Difficult to place patient No       Consultants:  Renal Palliative GI cardiology  Procedures:  none  Antimicrobials: Anti-infectives (From admission, onward)    Start     Dose/Rate Route Frequency Ordered Stop   01/11/2021 1400  piperacillin-tazobactam (ZOSYN) IVPB 2.25 g        2.25 g 100 mL/hr over 30 Minutes Intravenous Every 8 hours 12/15/2020 1205     12/22/20 1900  cefTRIAXone (ROCEPHIN) 2 g in sodium chloride 0.9 % 100 mL IVPB  Status:  Discontinued        2 g 200 mL/hr over 30 Minutes Intravenous Every 24 hours 01/05/2021 2001 01/02/2021 1133   01/03/2021 1645   cefTRIAXone (ROCEPHIN) 1 g in sodium chloride 0.9 % 100 mL IVPB        1 g 200 mL/hr over 30 Minutes Intravenous  Once 01/05/2021 1632 12/28/2020 1837   01/06/2021 1645  azithromycin (ZITHROMAX) 500 mg in sodium chloride 0.9 % 250 mL IVPB        500 mg 250 mL/hr over 60 Minutes Intravenous Every 24 hours 12/25/2020 1632            Subjective: Doesn't say to much, lethargic Seems comfortable Long discussion with family regarding decline, recommendation for comfort   Objective: Vitals:   12/24/20 2000 12/25/20 0000 12/25/20 0400 12/25/20 0735  BP: (!) 143/48 (!) 139/50 (!) 119/47 (!) 100/42  Pulse: (!) 57 (!) 48 (!) 48 (!) 52  Resp: 19 19 18 17   Temp: (!) 97.5 F (36.4 C) 97.6 F (36.4 C) (!) 97.4 F (36.3 C) (!) 97.4 F (36.3 C)  TempSrc: Axillary Axillary Axillary Axillary  SpO2: 100% 95% 93% (!) 85%  Weight:      Height:        Intake/Output Summary (Last 24 hours) at 12/25/2020 1059 Last data filed at 12/25/2020 0900 Gross per 24 hour  Intake 20 ml  Output --  Net 20 ml  Filed Weights   12/15/2020 1555  Weight: 68.9 kg    Examination:  General: No acute distress. Cardiovascular: RRR Lungs: unlabored on NRB, sats in the low 80's Abdomen: Soft, nontender, nondistended  Neurological: lethargic. Moves all extremities 4 . Cranial nerves II through XII grossly intact. Skin: Warm and dry. No rashes or lesions. Extremities: anasarca, upper>lower     Data Reviewed: I have personally reviewed following labs and imaging studies  CBC: Recent Labs  Lab 01/08/2021 2046 12/19/2020 2050 12/22/20 0800 12/15/2020 0219 12/25/20 0056  WBC  --  9.2 9.6 11.5* 12.7*  NEUTROABS  --  8.2* 9.1* 10.9* 12.2*  HGB 8.8* 7.4* 9.2* 8.4* 8.7*  HCT 26.0* 25.6* 32.2* 29.7* 29.6*  MCV  --  87.4 87.5 88.1 86.3  PLT  --  308 330 318 696    Basic Metabolic Panel: Recent Labs  Lab 01/05/2021 2049 12/22/20 0800 12/23/20 0210 01/01/2021 0219 12/25/20 0056  NA 142 145 145 144 135  K 4.1 4.3  4.6 4.1 3.7  CL 108 109 109 107 102  CO2 24 24 20* 25 22  GLUCOSE 131* 135* 115* 143* 181*  BUN 63* 65* 73* 86* 92*  CREATININE 4.16* 4.26* 4.63* 4.97* 5.08*  CALCIUM 9.8 10.0 10.3 10.3 9.9  MG 2.4 2.5*  --  2.5* 2.3  PHOS 4.7* 5.6*  --  7.3* 7.7*    GFR: Estimated Creatinine Clearance: 8.2 mL/min (Devonte Migues) (by C-G formula based on SCr of 5.08 mg/dL (H)).  Liver Function Tests: Recent Labs  Lab 12/14/2020 2049 12/22/20 0800 12/23/20 0210 01/10/2021 0219 12/25/20 0056  AST 327* 218* 88* 44* 44*  ALT 234* 219* 144* 99* 86*  ALKPHOS 115 131* 106 91 100  BILITOT 0.4 0.7 0.7 0.1* 0.1*  PROT 6.0* 6.7 6.3* 6.1* 5.9*  ALBUMIN 2.9* 3.4* 3.0* 2.9* 2.8*    CBG: Recent Labs  Lab 12/19/2020 1724 12/25/2020 2032 12/25/20 0103 12/25/20 0458 12/25/20 0737  GLUCAP 248* 237* 180* 105* 83     Recent Results (from the past 240 hour(s))  Resp Panel by RT-PCR (Flu Annell Canty&B, Covid) Nasopharyngeal Swab     Status: None   Collection Time: 01/08/2021  4:15 PM   Specimen: Nasopharyngeal Swab; Nasopharyngeal(NP) swabs in vial transport medium  Result Value Ref Range Status   SARS Coronavirus 2 by RT PCR NEGATIVE NEGATIVE Final    Comment: (NOTE) SARS-CoV-2 target nucleic acids are NOT DETECTED.  The SARS-CoV-2 RNA is generally detectable in upper respiratory specimens during the acute phase of infection. The lowest concentration of SARS-CoV-2 viral copies this assay can detect is 138 copies/mL. Deysi Soldo negative result does not preclude SARS-Cov-2 infection and should not be used as the sole basis for treatment or other patient management decisions. Carrie Usery negative result may occur with  improper specimen collection/handling, submission of specimen other than nasopharyngeal swab, presence of viral mutation(s) within the areas targeted by this assay, and inadequate number of viral copies(<138 copies/mL). Travontae Freiberger negative result must be combined with clinical observations, patient history, and epidemiological information.  The expected result is Negative.  Fact Sheet for Patients:  EntrepreneurPulse.com.au  Fact Sheet for Healthcare Providers:  IncredibleEmployment.be  This test is no t yet approved or cleared by the [place] FDA and  has been authorized for detection and/or diagnosis of SARS-CoV-2 by FDA under an Emergency Use Authorization (EUA). This EUA will remain  in effect (meaning this test can be used) for the duration of the COVID-19 declaration under Section 564(b)(1) of the  Act, 21 U.S.C.section 360bbb-3(b)(1), unless the authorization is terminated  or revoked sooner.       Influenza Shrika Milos by PCR NEGATIVE NEGATIVE Final   Influenza B by PCR NEGATIVE NEGATIVE Final    Comment: (NOTE) The Xpert Xpress SARS-CoV-2/FLU/RSV plus assay is intended as an aid in the diagnosis of influenza from Nasopharyngeal swab specimens and should not be used as Lyah Millirons sole basis for treatment. Nasal washings and aspirates are unacceptable for Xpert Xpress SARS-CoV-2/FLU/RSV testing.  Fact Sheet for Patients: EntrepreneurPulse.com.au  Fact Sheet for Healthcare Providers: IncredibleEmployment.be  This test is not yet approved or cleared by the [place] FDA and has been authorized for detection and/or diagnosis of SARS-CoV-2 by FDA under an Emergency Use Authorization (EUA). This EUA will remain in effect (meaning this test can be used) for the duration of the COVID-19 declaration under Section 564(b)(1) of the Act, 21 U.S.C. section 360bbb-3(b)(1), unless the authorization is terminated or revoked.  Performed at [facility], [place] [address] [place], [place] [address]   Culture, blood (routine x 2)     Status: Abnormal   Collection Time: 12/22/2020  5:30 PM   Specimen: BLOOD  Result Value Ref Range Status   Specimen Description BLOOD RIGHT ANTECUBITAL  Final   Special Requests   Final    BOTTLES DRAWN AEROBIC AND ANAEROBIC  Blood Culture results may not be optimal due to an inadequate volume of blood received in culture bottles   Culture  Setup Time   Final    GRAM POSITIVE COCCI IN BOTH AEROBIC AND ANAEROBIC BOTTLES CRITICAL RESULT CALLED TO, READ BACK BY AND VERIFIED WITH: PHARMD Fanchon Papania.LAWLES AT 1245 ON 12/22/2020 BY T.SAAD.    Culture (Jaekwon Mcclune)  Final    STAPHYLOCOCCUS EPIDERMIDIS THE SIGNIFICANCE OF ISOLATING THIS ORGANISM FROM Hani Campusano SINGLE SET OF BLOOD CULTURES WHEN MULTIPLE SETS ARE DRAWN IS UNCERTAIN. PLEASE NOTIFY THE MICROBIOLOGY DEPARTMENT WITHIN ONE WEEK IF SPECIATION AND SENSITIVITIES ARE REQUIRED. Performed at [facility], [place] [address] [place], [place] [address]    Report Status 01/11/2021 FINAL  Final  Culture, blood (routine x 2)     Status: Abnormal   Collection Time: 12/27/2020  5:30 PM   Specimen: BLOOD  Result Value Ref Range Status   Specimen Description BLOOD RIGHT ANTECUBITAL  Final   Special Requests   Final    BOTTLES DRAWN AEROBIC AND ANAEROBIC Blood Culture results may not be optimal due to an inadequate volume of blood received in culture bottles   Culture  Setup Time   Final    GRAM POSITIVE COCCI IN BOTH AEROBIC AND ANAEROBIC BOTTLES CRITICAL VALUE NOTED.  VALUE IS CONSISTENT WITH PREVIOUSLY REPORTED AND CALLED VALUE.    Culture (Chayne Baumgart)  Final    STAPHYLOCOCCUS SIMULANS THE SIGNIFICANCE OF ISOLATING THIS ORGANISM FROM Nick Armel SINGLE SET OF BLOOD CULTURES WHEN MULTIPLE SETS ARE DRAWN IS UNCERTAIN. PLEASE NOTIFY THE MICROBIOLOGY DEPARTMENT WITHIN ONE WEEK IF SPECIATION AND SENSITIVITIES ARE REQUIRED. Performed at [facility], [place] [address], [place], [place] [address]    Report Status 12/25/2020 FINAL  Final  Blood Culture ID Panel (Reflexed)     Status: Abnormal   Collection Time: 01/11/2021  5:30 PM  Result Value Ref Range Status   Enterococcus faecalis NOT DETECTED NOT DETECTED Final   Enterococcus Faecium NOT DETECTED NOT DETECTED Final   Listeria monocytogenes NOT DETECTED NOT  DETECTED Final   Staphylococcus species DETECTED (Sarita Hakanson) NOT DETECTED Final    Comment: CRITICAL  RESULT CALLED TO, READ BACK BY AND VERIFIED WITH: PHARMD Momo Braun.LAWLES AT 1041 ON 12/22/2020 BY T.SAAD.    Staphylococcus aureus (BCID) NOT DETECTED NOT DETECTED Final   Staphylococcus epidermidis DETECTED (Krysteena Stalker) NOT DETECTED Final    Comment: CRITICAL RESULT CALLED TO, READ BACK BY AND VERIFIED WITH: PHARMD Alivea Gladson.LAWLES AT 1041 ON 12/22/2020 BY T.SAAD.    Staphylococcus lugdunensis NOT DETECTED NOT DETECTED Final   Streptococcus species NOT DETECTED NOT DETECTED Final   Streptococcus agalactiae NOT DETECTED NOT DETECTED Final   Streptococcus pneumoniae NOT DETECTED NOT DETECTED Final   Streptococcus pyogenes NOT DETECTED NOT DETECTED Final   Hallelujah Wysong.calcoaceticus-baumannii NOT DETECTED NOT DETECTED Final   Bacteroides fragilis NOT DETECTED NOT DETECTED Final   Enterobacterales NOT DETECTED NOT DETECTED Final   Enterobacter cloacae complex NOT DETECTED NOT DETECTED Final   Escherichia coli NOT DETECTED NOT DETECTED Final   Klebsiella aerogenes NOT DETECTED NOT DETECTED Final   Klebsiella oxytoca NOT DETECTED NOT DETECTED Final   Klebsiella pneumoniae NOT DETECTED NOT DETECTED Final   Proteus species NOT DETECTED NOT DETECTED Final   Salmonella species NOT DETECTED NOT DETECTED Final   Serratia marcescens NOT DETECTED NOT DETECTED Final   Haemophilus influenzae NOT DETECTED NOT DETECTED Final   Neisseria meningitidis NOT DETECTED NOT DETECTED Final   Pseudomonas aeruginosa NOT DETECTED NOT DETECTED Final   Stenotrophomonas maltophilia NOT DETECTED NOT DETECTED Final   Candida albicans NOT DETECTED NOT DETECTED Final   Candida auris NOT DETECTED NOT DETECTED Final   Candida glabrata NOT DETECTED NOT DETECTED Final   Candida krusei NOT DETECTED NOT DETECTED Final   Candida parapsilosis NOT DETECTED NOT DETECTED Final   Candida tropicalis NOT DETECTED NOT DETECTED Final   Cryptococcus neoformans/gattii NOT  DETECTED NOT DETECTED Final   Methicillin resistance mecA/C NOT DETECTED NOT DETECTED Final    Comment: Performed at [facility], [address], [place], [place] [address]  Urine Culture     Status: None   Collection Time: 01/10/2021  9:46 PM   Specimen: Urine, Catheterized  Result Value Ref Range Status   Specimen Description URINE, CATHETERIZED  Final   Special Requests NONE  Final   Culture   Final    NO GROWTH Performed at [place] [address] [place], [place] [address]    Report Status 12/23/2020 FINAL  Final  MRSA Next Gen by PCR, Nasal     Status: None   Collection Time: 01/11/2021  9:59 PM  Result Value Ref Range Status   MRSA by PCR Next Gen NOT DETECTED NOT DETECTED Final    Comment: (NOTE) The GeneXpert MRSA Assay (FDA approved for NASAL specimens only), is one component of Maekayla Giorgio comprehensive MRSA colonization surveillance program. It is not intended to diagnose MRSA infection nor to guide or monitor treatment for MRSA infections. Test performance is not FDA approved in patients less than 26 years old. Performed at [facility], [place] [address], [place], [place] [address]   Culture, blood (routine x 2)     Status: None (Preliminary result)   Collection Time: 12/23/20 10:30 PM   Specimen: BLOOD  Result Value Ref Range Status   Specimen Description BLOOD RIGHT HAND  Final   Special Requests   Final    BOTTLES DRAWN AEROBIC ONLY Blood Culture results may not be optimal due to an inadequate volume of blood received in culture bottles   Culture   Final    NO GROWTH < 12 HOURS  Performed at [facility], [place] [address] [place], [place] [address]    Report Status PENDING  Incomplete  Culture, blood (routine x 2)     Status: None (Preliminary result)   Collection Time: 12/23/20 10:33 PM   Specimen: BLOOD  Result Value Ref Range Status   Specimen Description BLOOD LOWER RIGHT HAND  Final   Special Requests   Final    BOTTLES DRAWN AEROBIC ONLY  Blood Culture adequate volume   Culture   Final    NO GROWTH < 12 HOURS Performed at [facility], [place] [address] [place], [place] [address]    Report Status PENDING  Incomplete         Radiology Studies: DG CHEST PORT 1 VIEW  Result Date: 12/25/2020 CLINICAL DATA:  Shortness of breath. EXAM: PORTABLE CHEST 1 VIEW COMPARISON:  December 24, 2020. FINDINGS: Stable cardiomediastinal silhouette. Increased bilateral airspace opacities are noted, left greater than right, consistent with multifocal pneumonia. Small pleural effusions may be present. No pneumothorax is noted. Bony thorax is unremarkable. IMPRESSION: Increased bilateral airspace opacities are noted, left greater than right, consistent with multifocal pneumonia. Aortic Atherosclerosis (ICD10-I70.0). Electronically Signed   By: Marijo Conception M.D.   On: 12/25/2020 08:31   DG CHEST PORT 1 VIEW  Result Date: 12/25/2020 CLINICAL DATA:  80 year old female with history of altered mental status. Shortness of breath. EXAM: PORTABLE CHEST 1 VIEW COMPARISON:  Chest x-ray 12/23/2020. FINDINGS: Worsening patchy ill-defined airspace consolidation and areas of interstitial prominence in the lungs bilaterally, particularly worsened in the left upper lobe, compatible with progressive multilobar bilateral pneumonia. Possible small left pleural effusion. No definite right pleural effusion. No pneumothorax. Pulmonary vasculature does not appearing origin. Heart size is moderately enlarged. Upper mediastinal contours are within normal limits. Atherosclerotic calcifications in the thoracic aorta. IMPRESSION: 1. Progressive multilobar bilateral pneumonia, significantly worsened, particularly in the left upper lobe. 2. Moderate cardiomegaly. 3. Aortic atherosclerosis. Electronically Signed   By: Vinnie Langton M.D.   On: 12/15/2020 10:57   DG CHEST PORT 1 VIEW  Result Date: 12/23/2020 CLINICAL DATA:  Hypoxia EXAM: PORTABLE CHEST 1 VIEW COMPARISON:   01/03/2021 FINDINGS: Left lower lobe consolidation unchanged. Mild right lower lobe airspace disease unchanged. Negative for pulmonary edema. No significant effusion. IMPRESSION: No interval change. Bibasilar airspace disease left greater than right unchanged. Electronically Signed   By: Franchot Gallo M.D.   On: 12/23/2020 15:55        Scheduled Meds:  sodium chloride   Intravenous Once   brimonidine  1 drop Both Eyes q morning   hydrocortisone sod succinate (SOLU-CORTEF) inj  100 mg Intravenous Q8H   insulin aspart  0-9 Units Subcutaneous Q4H   latanoprost  1 drop Both Eyes QHS   levothyroxine  50 mcg Oral QAC breakfast   polyethylene glycol  17 g Oral BID   Continuous Infusions:  azithromycin 500 mg (01/05/2021 1748)   piperacillin-tazobactam (ZOSYN)  IV 2.25 g (12/25/20 0533)     LOS: 4 days    Time spent: over 30 min    Fayrene Helper, MD [facility]   To contact the attending provider between 7A-7P or the covering provider during after hours 7P-7A, please log into the web site [email or web address] and access using universal [place] password for that web site. If you do not have the password, please call the hospital operator.  12/25/2020, 10:59 AM

## 2020-12-25 NOTE — Significant Event (Signed)
Rapid Response Event Note   Reason for Call :  Decreased oxygen saturation  Initial Focused Assessment:  Received a call from the charge RN stating that this patient was having decreased oxygen saturation. The patient had been going back and forth between nasal canula and NRB. MD ordered for the patient to have both on to help her oxygen level. The patient denies any shortness of breath or chest pain at the moment. She does not appear to be in any acute distress at the moment. Currently, her saturations remain low, despite the two oxygen sources she has. She is not a candidate for bipap at the moment because of her mental status.  BP 117/84 HR 50 RR 17 O2 80% on NRB and [place]   Interventions:  Repositioned patient Vitals taken  Plan of Care:  Family is coming to visit this patient. Once family arrives, she will be transitioned to comfort care. MD discussed this plan with the son and her daughter (over the phone) and they agree with the plan of care.   The patient still remains a partial code, but MD and family agree to not escalate care at this time   Event Summary:   MD Notified: Dr Florene Glen bedside Call Time: [place] Time: 1026 End Time: [place], RN

## 2020-12-26 DIAGNOSIS — J189 Pneumonia, unspecified organism: Secondary | ICD-10-CM | POA: Diagnosis not present

## 2020-12-26 DIAGNOSIS — J9601 Acute respiratory failure with hypoxia: Secondary | ICD-10-CM | POA: Diagnosis not present

## 2020-12-26 DIAGNOSIS — R4182 Altered mental status, unspecified: Secondary | ICD-10-CM | POA: Diagnosis not present

## 2020-12-26 DIAGNOSIS — N185 Chronic kidney disease, stage 5: Secondary | ICD-10-CM | POA: Diagnosis not present

## 2020-12-26 DIAGNOSIS — F0390 Unspecified dementia without behavioral disturbance: Secondary | ICD-10-CM | POA: Diagnosis not present

## 2020-12-26 LAB — PATHOLOGIST SMEAR REVIEW

## 2020-12-29 LAB — CULTURE, BLOOD (ROUTINE X 2)
Culture: NO GROWTH
Culture: NO GROWTH
Special Requests: ADEQUATE

## 2021-01-14 NOTE — Death Summary Note (Addendum)
DEATH SUMMARY   Patient Details  Name: Allison Wang. Kleckley MRN: [id number] DOB: November 02, 1940  Admission/Discharge Information   Admit Date:  Jan 02, 2021  Date of Death: Date of Death: 01-07-21  Time of Death: Time of Death: 08/17/1241  Length of Stay: 5  Referring Physician: Roselee Nova, MD   Reason(s) for Hospitalization  Multifocal pneumonia, acute kidney injury, volume overload, bradycardia  Diagnoses  Preliminary cause of death: acute hypoxic respiratory failure secondary to multifocal pneumonia and volume overload with aki Secondary Diagnoses (including complications and co-morbidities):  Active Problems:   CAP (community acquired pneumonia)   Bradycardia   Acute metabolic encephalopathy   DM2 (diabetes mellitus, type 2) (HCC)   CKD (chronic kidney disease), stage V (HCC)   Dementia (HCC)   Essential hypertension   Chronic diastolic CHF (congestive heart failure) (HCC)   Acute on chronic renal failure (HCC)   Elevated LFTs   Hyperkalemia   Acute respiratory failure with hypoxia (HCC)   Anemia due to chronic kidney disease   Symptomatic anemia   Altered mental status   Goals of care, counseling/discussion   Shortness of breath   Hypoxia   Brief Hospital Course (including significant findings, care, treatment, and services provided and events leading to death)  80 y.o. female with medical history significant of covid infection Jan 2022, gout,thyroid disease, HTN, HLD, NIDDM2, CKDV, chronic dCHF,  Pituitary disease, dementia who was admitted with acute hypoxic respiratory failure secondary to pneumonia.  Her hospitalization was also complicated by bradycardia (with cards consult) and AKI (with renal consult).  She required 1 unit pRBC for anemia and GI was also consulted for elevated LFTs.  Her renal function was progressively worsening.  Renal noted she was not [facility] [place] dialysis candidate.  Palliative care was consulted and followed her case as well.  She developed progressively  worsening hypoxia and volume overload which were felt due to Jaren Kearn combination of AKI and multifocal pneumonia.  Her condition progressed despite diuresis and antibiotics.  Solangel Mcmanaway decision was made for palliative care on 8/12.  She was started on Josefa Syracuse dilaudid gtt with Jahanna Raether plan to gradually taper her supplemental oxygen, she passed away shortly after her O2 was weaned to 11 L today.  Her family was at bedside and I expressed my condolences.    Pertinent Labs and Studies  Significant Diagnostic Studies CT HEAD WO CONTRAST (5MM)  Result Date: 01/02/2021 CLINICAL DATA:  Mental status change, unknown cause mental status change EXAM: CT HEAD WITHOUT CONTRAST TECHNIQUE: Contiguous axial images were obtained from the base of the skull through the vertex without intravenous contrast. COMPARISON:  Head CT 06/07/2020. FINDINGS: Brain: Heterogeneous density in the sella turcica of with sellar expansion, not significantly changed from prior head CT. No acute intracranial hemorrhage. Stable generalized atrophy and chronic small vessel ischemia. No subdural or extra-axial collection. No evidence of acute ischemia. No midline shift. Vascular: No hyperdense vessel. Skull: No skull fracture. Sinuses/Orbits: Total opacification of right mastoid air cells, unchanged from prior exam. Subtotal opacification of lower left mastoid air cells, also unchanged. Bilateral cataract resection. Other: None. IMPRESSION: 1. No acute intracranial abnormality. 2. Stable atrophy and chronic small vessel ischemia. 3. Heterogeneous density in the sella turcica with sellar expansion, not significantly changed from prior head CT. Patient with reported history of prior pituitary surgery. This is incompletely assessed by CT. As clinically indicated, recommend further assessment with pituitary protocol MRI. 4. Bilateral mastoid effusions, unchanged from prior. Electronically Signed   By: Keith Rake  M.D.   On: 12/20/2020 18:53   US RENAL  Result Date:  12/01/2020 CLINICAL DATA:  80 year old female with chronic kidney disease. EXAM: RENAL / URINARY TRACT ULTRASOUND COMPLETE COMPARISON:  None. FINDINGS: Right Kidney: Renal measurements: 6.6 x 3.5 x 4.0 cm = volume: 48 mL. There is diffuse increased renal parenchymal echogenicity. There is Leibish Mcgregor 2 cm upper pole cyst. Additional smaller interpolar cyst noted. No hydronephrosis or shadowing stone. Left Kidney: Renal measurements: 8.0 x 3.5 x 3.6 cm = volume: 54 mL. There is diffuse increased left renal parenchymal echogenicity. No hydronephrosis or shadowing stone. Several cysts measure up to 3 cm in the upper pole. Bladder: Appears normal for degree of bladder distention. Other: None. IMPRESSION: 1. Echogenic kidneys, likely related to chronic kidney disease. No hydronephrosis or shadowing stone. 2. Small bilateral renal cysts. Electronically Signed   By: Anner Crete M.D.   On: 12/01/2020 21:44   DG CHEST PORT 1 VIEW  Result Date: 12/25/2020 CLINICAL DATA:  Shortness of breath. EXAM: PORTABLE CHEST 1 VIEW COMPARISON:  December 24, 2020. FINDINGS: Stable cardiomediastinal silhouette. Increased bilateral airspace opacities are noted, left greater than right, consistent with multifocal pneumonia. Small pleural effusions may be present. No pneumothorax is noted. Bony thorax is unremarkable. IMPRESSION: Increased bilateral airspace opacities are noted, left greater than right, consistent with multifocal pneumonia. Aortic Atherosclerosis (ICD10-I70.0). Electronically Signed   By: Marijo Conception M.D.   On: 12/25/2020 08:31   DG CHEST PORT 1 VIEW  Result Date: 12/27/2020 CLINICAL DATA:  80 year old female with history of altered mental status. Shortness of breath. EXAM: PORTABLE CHEST 1 VIEW COMPARISON:  Chest x-ray 12/23/2020. FINDINGS: Worsening patchy ill-defined airspace consolidation and areas of interstitial prominence in the lungs bilaterally, particularly worsened in the left upper lobe, compatible with  progressive multilobar bilateral pneumonia. Possible small left pleural effusion. No definite right pleural effusion. No pneumothorax. Pulmonary vasculature does not appearing origin. Heart size is moderately enlarged. Upper mediastinal contours are within normal limits. Atherosclerotic calcifications in the thoracic aorta. IMPRESSION: 1. Progressive multilobar bilateral pneumonia, significantly worsened, particularly in the left upper lobe. 2. Moderate cardiomegaly. 3. Aortic atherosclerosis. Electronically Signed   By: Vinnie Langton M.D.   On: 12/23/2020 10:57   DG CHEST PORT 1 VIEW  Result Date: 12/23/2020 CLINICAL DATA:  Hypoxia EXAM: PORTABLE CHEST 1 VIEW COMPARISON:  12/25/2020 FINDINGS: Left lower lobe consolidation unchanged. Mild right lower lobe airspace disease unchanged. Negative for pulmonary edema. No significant effusion. IMPRESSION: No interval change. Bibasilar airspace disease left greater than right unchanged. Electronically Signed   By: Franchot Gallo M.D.   On: 12/23/2020 15:55   DG Chest Portable 1 View  Result Date: 01/09/2021 CLINICAL DATA:  Mental status change EXAM: PORTABLE CHEST 1 VIEW COMPARISON:  06/07/2020 FINDINGS: Low lung volumes. Interim development of patchy airspace disease and consolidation in the left thorax. Cardiomegaly with aortic atherosclerosis. IMPRESSION: 1. Interim development of airspace disease and consolidation in left thorax concerning for pneumonia 2. Cardiomegaly Electronically Signed   By: Donavan Foil M.D.   On: 01/03/2021 16:23   US Abdomen Limited RUQ (LIVER/GB)  Result Date: 12/25/2020 CLINICAL DATA:  Elevated LFTs EXAM: ULTRASOUND ABDOMEN LIMITED RIGHT UPPER QUADRANT COMPARISON:  None. FINDINGS: Gallbladder: No shadowing stone. Upper normal gallbladder wall thickness. Negative sonographic Murphy. Common bile duct: Diameter: 3 mm Liver: No focal lesion identified. Within normal limits in parenchymal echogenicity. Portal vein is patent on color  Doppler imaging with normal direction of blood flow towards the  liver. Other: Right kidney appears slightly echogenic. IMPRESSION: 1. Negative for gallstones or biliary dilatation. 2. Right kidney appears slightly echogenic consistent with medical renal disease Electronically Signed   By: Donavan Foil M.D.   On: 01/06/2021 21:09    Microbiology Recent Results (from the past 240 hour(s))  Resp Panel by RT-PCR (Flu Ellon Marasco&B, Covid) Nasopharyngeal Swab     Status: None   Collection Time: 01/07/2021  4:15 PM   Specimen: Nasopharyngeal Swab; Nasopharyngeal(NP) swabs in vial transport medium  Result Value Ref Range Status   SARS Coronavirus 2 by RT PCR NEGATIVE NEGATIVE Final    Comment: (NOTE) SARS-CoV-2 target nucleic acids are NOT DETECTED.  The SARS-CoV-2 RNA is generally detectable in upper respiratory specimens during the acute phase of infection. The lowest concentration of SARS-CoV-2 viral copies this assay can detect is 138 copies/mL. Teniola Tseng negative result does not preclude SARS-Cov-2 infection and should not be used as the sole basis for treatment or other patient management decisions. Kisa Fujii negative result may occur with  improper specimen collection/handling, submission of specimen other than nasopharyngeal swab, presence of viral mutation(s) within the areas targeted by this assay, and inadequate number of viral copies(<138 copies/mL). Oberon Hehir negative result must be combined with clinical observations, patient history, and epidemiological information. The expected result is Negative.  Fact Sheet for Patients:  EntrepreneurPulse.com.au  Fact Sheet for Healthcare Providers:  IncredibleEmployment.be  This test is no t yet approved or cleared by the [place] FDA and  has been authorized for detection and/or diagnosis of SARS-CoV-2 by FDA under an Emergency Use Authorization (EUA). This EUA will remain  in effect (meaning this test can be used) for the  duration of the COVID-19 declaration under Section 564(b)(1) of the Act, 21 U.S.C.section 360bbb-3(b)(1), unless the authorization is terminated  or revoked sooner.       Influenza Josie Burleigh by PCR NEGATIVE NEGATIVE Final   Influenza B by PCR NEGATIVE NEGATIVE Final    Comment: (NOTE) The Xpert Xpress SARS-CoV-2/FLU/RSV plus assay is intended as an aid in the diagnosis of influenza from Nasopharyngeal swab specimens and should not be used as Allyanna Appleman sole basis for treatment. Nasal washings and aspirates are unacceptable for Xpert Xpress SARS-CoV-2/FLU/RSV testing.  Fact Sheet for Patients: EntrepreneurPulse.com.au  Fact Sheet for Healthcare Providers: IncredibleEmployment.be  This test is not yet approved or cleared by the [place] FDA and has been authorized for detection and/or diagnosis of SARS-CoV-2 by FDA under an Emergency Use Authorization (EUA). This EUA will remain in effect (meaning this test can be used) for the duration of the COVID-19 declaration under Section 564(b)(1) of the Act, 21 U.S.C. section 360bbb-3(b)(1), unless the authorization is terminated or revoked.  Performed at [facility], [place] [address], [place], [place] [address]   Culture, blood (routine x 2)     Status: Abnormal   Collection Time: 12/30/2020  5:30 PM   Specimen: BLOOD  Result Value Ref Range Status   Specimen Description BLOOD RIGHT ANTECUBITAL  Final   Special Requests   Final    BOTTLES DRAWN AEROBIC AND ANAEROBIC Blood Culture results may not be optimal due to an inadequate volume of blood received in culture bottles   Culture  Setup Time   Final    GRAM POSITIVE COCCI IN BOTH AEROBIC AND ANAEROBIC BOTTLES CRITICAL RESULT CALLED TO, READ BACK BY AND VERIFIED WITH: PHARMD Lien Lyman.LAWLES AT 4709 ON 12/22/2020 BY T.SAAD.    Culture (Loana Salvaggio)  Final    STAPHYLOCOCCUS EPIDERMIDIS  THE SIGNIFICANCE OF ISOLATING THIS ORGANISM FROM Maddyn Lieurance SINGLE SET OF BLOOD CULTURES WHEN  MULTIPLE SETS ARE DRAWN IS UNCERTAIN. PLEASE NOTIFY THE MICROBIOLOGY DEPARTMENT WITHIN ONE WEEK IF SPECIATION AND SENSITIVITIES ARE REQUIRED. Performed at [facility], [place] [address] [place], [place] [address]    Report Status 12/20/2020 FINAL  Final  Culture, blood (routine x 2)     Status: Abnormal   Collection Time: 01/13/2021  5:30 PM   Specimen: BLOOD  Result Value Ref Range Status   Specimen Description BLOOD RIGHT ANTECUBITAL  Final   Special Requests   Final    BOTTLES DRAWN AEROBIC AND ANAEROBIC Blood Culture results may not be optimal due to an inadequate volume of blood received in culture bottles   Culture  Setup Time   Final    GRAM POSITIVE COCCI IN BOTH AEROBIC AND ANAEROBIC BOTTLES CRITICAL VALUE NOTED.  VALUE IS CONSISTENT WITH PREVIOUSLY REPORTED AND CALLED VALUE.    Culture (Shawntavia Saunders)  Final    STAPHYLOCOCCUS SIMULANS THE SIGNIFICANCE OF ISOLATING THIS ORGANISM FROM Shanae Luo SINGLE SET OF BLOOD CULTURES WHEN MULTIPLE SETS ARE DRAWN IS UNCERTAIN. PLEASE NOTIFY THE MICROBIOLOGY DEPARTMENT WITHIN ONE WEEK IF SPECIATION AND SENSITIVITIES ARE REQUIRED. Performed at [facility], [place] [address], [place], [place] [address]    Report Status 12/25/2020 FINAL  Final  Blood Culture ID Panel (Reflexed)     Status: Abnormal   Collection Time: 01/03/2021  5:30 PM  Result Value Ref Range Status   Enterococcus faecalis NOT DETECTED NOT DETECTED Final   Enterococcus Faecium NOT DETECTED NOT DETECTED Final   Listeria monocytogenes NOT DETECTED NOT DETECTED Final   Staphylococcus species DETECTED (Alvis Edgell) NOT DETECTED Final    Comment: CRITICAL RESULT CALLED TO, READ BACK BY AND VERIFIED WITH: PHARMD Deone Leifheit.LAWLES AT 1041 ON 12/22/2020 BY T.SAAD.    Staphylococcus aureus (BCID) NOT DETECTED NOT DETECTED Final   Staphylococcus epidermidis DETECTED (Sashay Felling) NOT DETECTED Final    Comment: CRITICAL RESULT CALLED TO, READ BACK BY AND VERIFIED WITH: PHARMD Brynlei Klausner.LAWLES AT 1041 ON 12/22/2020 BY T.SAAD.     Staphylococcus lugdunensis NOT DETECTED NOT DETECTED Final   Streptococcus species NOT DETECTED NOT DETECTED Final   Streptococcus agalactiae NOT DETECTED NOT DETECTED Final   Streptococcus pneumoniae NOT DETECTED NOT DETECTED Final   Streptococcus pyogenes NOT DETECTED NOT DETECTED Final   Acxel Dingee.calcoaceticus-baumannii NOT DETECTED NOT DETECTED Final   Bacteroides fragilis NOT DETECTED NOT DETECTED Final   Enterobacterales NOT DETECTED NOT DETECTED Final   Enterobacter cloacae complex NOT DETECTED NOT DETECTED Final   Escherichia coli NOT DETECTED NOT DETECTED Final   Klebsiella aerogenes NOT DETECTED NOT DETECTED Final   Klebsiella oxytoca NOT DETECTED NOT DETECTED Final   Klebsiella pneumoniae NOT DETECTED NOT DETECTED Final   Proteus species NOT DETECTED NOT DETECTED Final   Salmonella species NOT DETECTED NOT DETECTED Final   Serratia marcescens NOT DETECTED NOT DETECTED Final   Haemophilus influenzae NOT DETECTED NOT DETECTED Final   Neisseria meningitidis NOT DETECTED NOT DETECTED Final   Pseudomonas aeruginosa NOT DETECTED NOT DETECTED Final   Stenotrophomonas maltophilia NOT DETECTED NOT DETECTED Final   Candida albicans NOT DETECTED NOT DETECTED Final   Candida auris NOT DETECTED NOT DETECTED Final   Candida glabrata NOT DETECTED NOT DETECTED Final   Candida krusei NOT DETECTED NOT DETECTED Final   Candida parapsilosis NOT DETECTED NOT DETECTED Final   Candida tropicalis NOT DETECTED NOT DETECTED Final   Cryptococcus neoformans/gattii NOT DETECTED NOT DETECTED  Final   Methicillin resistance mecA/C NOT DETECTED NOT DETECTED Final    Comment: Performed at [facility], [place] [address], [place], [place] [address]  Urine Culture     Status: None   Collection Time: 12/22/2020  9:46 PM   Specimen: Urine, Catheterized  Result Value Ref Range Status   Specimen Description URINE, CATHETERIZED  Final   Special Requests NONE  Final   Culture   Final    NO GROWTH Performed at [place] [address], [place], [place] [address]    Report Status 12/23/2020 FINAL  Final  MRSA Next Gen by PCR, Nasal     Status: None   Collection Time: 01/06/2021  9:59 PM  Result Value Ref Range Status   MRSA by PCR Next Gen NOT DETECTED NOT DETECTED Final    Comment: (NOTE) The GeneXpert MRSA Assay (FDA approved for NASAL specimens only), is one component of Danyal Whitenack comprehensive MRSA colonization surveillance program. It is not intended to diagnose MRSA infection nor to guide or monitor treatment for MRSA infections. Test performance is not FDA approved in patients less than 37 years old. Performed at [facility], [place] [address] [place], [place] [address]   Culture, blood (routine x 2)     Status: None (Preliminary result)   Collection Time: 12/23/20 10:30 PM   Specimen: BLOOD  Result Value Ref Range Status   Specimen Description BLOOD RIGHT HAND  Final   Special Requests   Final    BOTTLES DRAWN AEROBIC ONLY Blood Culture results may not be optimal due to an inadequate volume of blood received in culture bottles   Culture   Final    NO GROWTH 4 DAYS Performed at [facility], [place] [address] [place], [place] [address]    Report Status PENDING  Incomplete  Culture, blood (routine x 2)     Status: None (Preliminary result)   Collection Time: 12/23/20 10:33 PM   Specimen: BLOOD  Result Value Ref Range Status   Specimen Description BLOOD LOWER RIGHT HAND  Final   Special Requests   Final    BOTTLES DRAWN AEROBIC ONLY Blood Culture adequate volume   Culture   Final    NO GROWTH 4 DAYS Performed at [facility], [place] [address] [place], [place] [address]    Report Status PENDING  Incomplete    Lab Basic Metabolic Panel: Recent Labs  Lab 12/22/20 0800 12/23/20 0210 01/02/2021 0219 12/25/20 0056 12/25/20 0849  NA 145 145 144 135 139  K 4.3 4.6 4.1 3.7 4.1  CL 109 109 107 102 105  CO2 24 20* 25 22 19*  GLUCOSE 135* 115* 143* 181* 67*  BUN 65*  73* 86* 92* 96*  CREATININE 4.26* 4.63* 4.97* 5.08* 5.11*  CALCIUM 10.0 10.3 10.3 9.9 9.9  MG 2.5*  --  2.5* 2.3  --   PHOS 5.6*  --  7.3* 7.7*  --    Liver Function Tests: Recent Labs  Lab 12/22/20 0800 12/23/20 0210 12/14/2020 0219 12/25/20 0056 12/25/20 0849  AST 218* 88* 44* 44* 37  ALT 219* 144* 99* 86* 75*  ALKPHOS 131* 106 91 100 85  BILITOT 0.7 0.7 0.1* 0.1* <0.1*  PROT 6.7 6.3* 6.1* 5.9* 5.8*  ALBUMIN 3.4* 3.0* 2.9* 2.8* 2.7*   No results for input(s): LIPASE, AMYLASE in the last 168 hours. No results for input(s): AMMONIA in the last 168 hours.  CBC: Recent Labs  Lab 12/22/20 0800 12/30/2020 0219 12/25/20 0056 12/25/20 0849  WBC 9.6 11.5* 12.7* 11.1*  NEUTROABS 9.1* 10.9* 12.2* 10.7*  HGB 9.2* 8.4* 8.7* 8.4*  HCT 32.2* 29.7* 29.6* 29.3*  MCV 87.5 88.1 86.3 86.4  PLT 330 318 326 281   Cardiac Enzymes: No results for input(s): CKTOTAL, CKMB, CKMBINDEX, TROPONINI in the last 168 hours.  Sepsis Labs: Recent Labs  Lab 12/22/20 0800 12/20/2020 0219 12/25/20 0056 12/25/20 0849  WBC 9.6 11.5* 12.7* 11.1*    Procedures/Operations  none   Fayrene Helper 12/28/2020, 10:51 PM

## 2021-01-14 NOTE — Progress Notes (Signed)
Dilaudid 50mg  in 158ml NS infusion (0.5 mg/ml) wasted. 62ml wasted w/ Rosalita Levan, RN.

## 2021-01-14 NOTE — Progress Notes (Signed)
PROGRESS NOTE    Allison Wang. Marvel Plan  XQJ:[id number] DOB: Aug 23, 1940 DOA: 01/10/2021 PCP: Roselee Nova, MD   Chief Complaint  Patient presents with   Altered Mental Status   Brief Narrative:  80 y.o. female with medical history significant of covid infection Jan 2022, gout,thyroid disease, HTN, HLD, NIDDM2, CKDV, chronic dCHF,  Pituitary disease, dementia,    Admitted for  CAP acute resp failure with hypoxia, bradycardia, aki, hyperkalemia.  Assessment & Plan:   Active Problems:   CAP (community acquired pneumonia)   Bradycardia   Acute metabolic encephalopathy   DM2 (diabetes mellitus, type 2) (HCC)   CKD (chronic kidney disease), stage V (HCC)   Dementia (HCC)   Essential hypertension   Chronic diastolic CHF (congestive heart failure) (HCC)   Acute on chronic renal failure (HCC)   Elevated LFTs   Hyperkalemia   Acute respiratory failure with hypoxia (HCC)   Anemia due to chronic kidney disease   Symptomatic anemia   Altered mental status   Goals of care, counseling/discussion   Shortness of breath   Hypoxia  Goals of care Continued worsening of respiratory status today.  Not maintaining O2 with NRB (desatting to 80's), worsening opacities on CXR.  Received 2 doses of high dose lasix 8/11 without notable improvement.  Creatinine worsened 8/12.  With her declining, think that Kajal Scalici transition to comfort is reasonable, they'd been discussing hospice, comfort with palliative.     Comfort measures 8/12 initiated irregular breathing noted on exam, appears overall comfortable On 15 L HFNC today on dilaudid gtt - weaned to 11 L  Acute hypoxic respiratory failure  Community Acquired Pneumonia  Volume Overload Desatting on NRB this AM CXR with bibasilar airspace disease L>R CXR with progressive multilobar bilateral pneumonia CXR shows increased bilateral airspace opacities, L>R Broaden to zosyn, continue azithromycin for now S/p lasix 120 mg x2 on 8/11 (UOP not well recorded,  but clinically did not meaningfully respond based on her worsening respiratory status) Blood cultures positive, likely contaminant, see below Negative MRSA PCR, follow urine strep, urine leginella Sputum cx if possible SLP eval  - dysphagia 2, thin liquid Suspect worsening respiratory status 2/2 progressive renal failure/multifocal pneumonia - as noted above, with worsening, expect to transition to comfort measures   AKI on CKD 5  Volume Overload -renal function worsening today -thought due to ischemic/prerenal insults in setting of pneumonia  -renal notes poor dialysis candiate - signed off, recommending supportive measures - follow with diuresis with overload  Positive Blood Cultures Suspect contaminants, follow repeat cultures (continued NG at this time)  Acute metabolic encephalopathy with baseline dementia -does not recognize family members at baseline.   -daughter reports patient is more sleepy than normal -could be from pneumonia and aki -delirium precautions, w/u additionally as needed   Elevation of LFT -CK unremarkable, right upper quadrant ultrasound no acute findings -Seen by GI, thought LFT elevation due to infection, no plan for ERCP -GI recommend treat underlying pneumonia, trend LFT   Acute on chronic anemia -Seen by GI do not think she is Terre Hanneman candidate for EGD colonoscopy -Likely component of anemia of chronic disease -She received 1 unit of PRBC, hemoglobin increased from 7.4-9.2 -Monitor CBC   Sinus bradycardia/elevation of troponin Seen by cardiology Thought due to AKI hyperkalemia Troponin elevation due to demand ischemia, can follow outpatient for ischemic work-up if patient and family wishes Avoid AV nodal blocking agent, cardiology recommend discontinue aricept, cardiology recommend palliative care consult    Chronic  diastolic CHF Seen by cardiology Patient not Martasia Talamante candidate for beta-blocker due to bradycardia Okay to resume Lasix 20 mg daily once  infection resolves (trial of diuresis as noted above)   Non-insulin-dependent type 2 diabetes On DPP 4 inhibitor at home Currently on ssi  With hypoglycemia due to poor oral intake, on hypoglycemic protocol   Hypothyroidism Continue Synthroid   Pituitary tumor -continue hydrocortisone (stress dose)   FTT: needs goals of care discussion, palliative care consulted   DVT prophylaxis: SCD Code Status:partial  Family Communication: daughter at bedside Disposition:   Status is: Inpatient  Remains inpatient appropriate because:Inpatient level of care appropriate due to severity of illness  Dispo: The patient is from: Home              Anticipated d/c is to:  pending              Patient currently is not medically stable to d/c.   Difficult to place patient No       Consultants:  Renal Palliative GI cardiology  Procedures:  none  Antimicrobials: Anti-infectives (From admission, onward)    Start     Dose/Rate Route Frequency Ordered Stop   12/18/2020 1400  piperacillin-tazobactam (ZOSYN) IVPB 2.25 g  Status:  Discontinued        2.25 g 100 mL/hr over 30 Minutes Intravenous Every 8 hours 12/25/2020 1205 12/25/20 1444   12/22/20 1900  cefTRIAXone (ROCEPHIN) 2 g in sodium chloride 0.9 % 100 mL IVPB  Status:  Discontinued        2 g 200 mL/hr over 30 Minutes Intravenous Every 24 hours 12/25/2020 2001 01/03/2021 1133   01/09/2021 1645  cefTRIAXone (ROCEPHIN) 1 g in sodium chloride 0.9 % 100 mL IVPB        1 g 200 mL/hr over 30 Minutes Intravenous  Once 01/10/2021 1632 12/30/2020 1837   01/09/2021 1645  azithromycin (ZITHROMAX) 500 mg in sodium chloride 0.9 % 250 mL IVPB  Status:  Discontinued        500 mg 250 mL/hr over 60 Minutes Intravenous Every 24 hours 12/30/2020 1632 12/25/20 1444          Subjective: unresponsive  Objective: Vitals:   12/25/20 0400 12/25/20 0735 12/25/20 1207 January 12, 2021 0754  BP: (!) 119/47 (!) 100/42 117/84 (!) 64/28  Pulse: (!) 48 (!) 52 (!) 51 (!) 34   Resp: 18 17 15 12   Temp: (!) 97.4 F (36.3 C) (!) 97.4 F (36.3 C)    TempSrc: Axillary Axillary Axillary   SpO2: 93% (!) 85% 97% (!) 88%  Weight:      Height:       No intake or output data in the 24 hours ending 2021-01-12 1251  Filed Weights   12/28/2020 1555  Weight: 68.9 kg    Examination:  General: No acute distress. Cardiovascular: RRR Lungs: irregular deep breaths Skin: Warm and dry.   Data Reviewed: I have personally reviewed following labs and imaging studies  CBC: Recent Labs  Lab 12/19/2020 2050 12/22/20 0800 01/05/2021 0219 12/25/20 0056 12/25/20 0849  WBC 9.2 9.6 11.5* 12.7* 11.1*  NEUTROABS 8.2* 9.1* 10.9* 12.2* 10.7*  HGB 7.4* 9.2* 8.4* 8.7* 8.4*  HCT 25.6* 32.2* 29.7* 29.6* 29.3*  MCV 87.4 87.5 88.1 86.3 86.4  PLT 308 330 318 326 169    Basic Metabolic Panel: Recent Labs  Lab 01/13/2021 2049 12/22/20 0800 12/23/20 0210 01/10/2021 0219 12/25/20 0056 12/25/20 0849  NA 142 145 145 144 135 139  K  4.1 4.3 4.6 4.1 3.7 4.1  CL 108 109 109 107 102 105  CO2 24 24 20* 25 22 19*  GLUCOSE 131* 135* 115* 143* 181* 67*  BUN 63* 65* 73* 86* 92* 96*  CREATININE 4.16* 4.26* 4.63* 4.97* 5.08* 5.11*  CALCIUM 9.8 10.0 10.3 10.3 9.9 9.9  MG 2.4 2.5*  --  2.5* 2.3  --   PHOS 4.7* 5.6*  --  7.3* 7.7*  --     GFR: Estimated Creatinine Clearance: 8.2 mL/min (Kylar Leonhardt) (by C-G formula based on SCr of 5.11 mg/dL (H)).  Liver Function Tests: Recent Labs  Lab 12/22/20 0800 12/23/20 0210 12/16/2020 0219 12/25/20 0056 12/25/20 0849  AST 218* 88* 44* 44* 37  ALT 219* 144* 99* 86* 75*  ALKPHOS 131* 106 91 100 85  BILITOT 0.7 0.7 0.1* 0.1* <0.1*  PROT 6.7 6.3* 6.1* 5.9* 5.8*  ALBUMIN 3.4* 3.0* 2.9* 2.8* 2.7*    CBG: Recent Labs  Lab 01/06/2021 2032 12/25/20 0103 12/25/20 0458 12/25/20 0737 12/25/20 1213  GLUCAP 237* 180* 105* 83 158*     Recent Results (from the past 240 hour(s))  Resp Panel by RT-PCR (Flu Thompson Mckim&B, Covid) Nasopharyngeal Swab     Status: None    Collection Time: 12/20/2020  4:15 PM   Specimen: Nasopharyngeal Swab; Nasopharyngeal(NP) swabs in vial transport medium  Result Value Ref Range Status   SARS Coronavirus 2 by RT PCR NEGATIVE NEGATIVE Final    Comment: (NOTE) SARS-CoV-2 target nucleic acids are NOT DETECTED.  The SARS-CoV-2 RNA is generally detectable in upper respiratory specimens during the acute phase of infection. The lowest concentration of SARS-CoV-2 viral copies this assay can detect is 138 copies/mL. Abdi Husak negative result does not preclude SARS-Cov-2 infection and should not be used as the sole basis for treatment or other patient management decisions. Channie Bostick negative result may occur with  improper specimen collection/handling, submission of specimen other than nasopharyngeal swab, presence of viral mutation(s) within the areas targeted by this assay, and inadequate number of viral copies(<138 copies/mL). Janae Bonser negative result must be combined with clinical observations, patient history, and epidemiological information. The expected result is Negative.  Fact Sheet for Patients:  EntrepreneurPulse.com.au  Fact Sheet for Healthcare Providers:  IncredibleEmployment.be  This test is no t yet approved or cleared by the [place] FDA and  has been authorized for detection and/or diagnosis of SARS-CoV-2 by FDA under an Emergency Use Authorization (EUA). This EUA will remain  in effect (meaning this test can be used) for the duration of the COVID-19 declaration under Section 564(b)(1) of the Act, 21 U.S.C.section 360bbb-3(b)(1), unless the authorization is terminated  or revoked sooner.       Influenza Yvone Slape by PCR NEGATIVE NEGATIVE Final   Influenza B by PCR NEGATIVE NEGATIVE Final    Comment: (NOTE) The Xpert Xpress SARS-CoV-2/FLU/RSV plus assay is intended as an aid in the diagnosis of influenza from Nasopharyngeal swab specimens and should not be used as Justa Hatchell sole basis for treatment.  Nasal washings and aspirates are unacceptable for Xpert Xpress SARS-CoV-2/FLU/RSV testing.  Fact Sheet for Patients: EntrepreneurPulse.com.au  Fact Sheet for Healthcare Providers: IncredibleEmployment.be  This test is not yet approved or cleared by the [place] FDA and has been authorized for detection and/or diagnosis of SARS-CoV-2 by FDA under an Emergency Use Authorization (EUA). This EUA will remain in effect (meaning this test can be used) for the duration of the COVID-19 declaration under Section 564(b)(1) of the Act, 21 U.S.C. section 360bbb-3(b)(1), unless  the authorization is terminated or revoked.  Performed at [facility], [place] [address], [place], [place] [address]   Culture, blood (routine x 2)     Status: Abnormal   Collection Time: 12/31/2020  5:30 PM   Specimen: BLOOD  Result Value Ref Range Status   Specimen Description BLOOD RIGHT ANTECUBITAL  Final   Special Requests   Final    BOTTLES DRAWN AEROBIC AND ANAEROBIC Blood Culture results may not be optimal due to an inadequate volume of blood received in culture bottles   Culture  Setup Time   Final    GRAM POSITIVE COCCI IN BOTH AEROBIC AND ANAEROBIC BOTTLES CRITICAL RESULT CALLED TO, READ BACK BY AND VERIFIED WITH: PHARMD Carel [email or web address] AT 7253 ON 12/22/2020 BY T.SAAD.    Culture (Rheya Minogue)  Final    STAPHYLOCOCCUS EPIDERMIDIS THE SIGNIFICANCE OF ISOLATING THIS ORGANISM FROM Tanyika Barros SINGLE SET OF BLOOD CULTURES WHEN MULTIPLE SETS ARE DRAWN IS UNCERTAIN. PLEASE NOTIFY THE MICROBIOLOGY DEPARTMENT WITHIN ONE WEEK IF SPECIATION AND SENSITIVITIES ARE REQUIRED. Performed at [facility], [place] [address], [place], [place] [address]    Report Status 01/03/2021 FINAL  Final  Culture, blood (routine x 2)     Status: Abnormal   Collection Time: 12/27/2020  5:30 PM   Specimen: BLOOD  Result Value Ref Range Status   Specimen Description BLOOD RIGHT ANTECUBITAL  Final   Special Requests    Final    BOTTLES DRAWN AEROBIC AND ANAEROBIC Blood Culture results may not be optimal due to an inadequate volume of blood received in culture bottles   Culture  Setup Time   Final    GRAM POSITIVE COCCI IN BOTH AEROBIC AND ANAEROBIC BOTTLES CRITICAL VALUE NOTED.  VALUE IS CONSISTENT WITH PREVIOUSLY REPORTED AND CALLED VALUE.    Culture (Sarafina Puthoff)  Final    STAPHYLOCOCCUS SIMULANS THE SIGNIFICANCE OF ISOLATING THIS ORGANISM FROM Odetta Forness SINGLE SET OF BLOOD CULTURES WHEN MULTIPLE SETS ARE DRAWN IS UNCERTAIN. PLEASE NOTIFY THE MICROBIOLOGY DEPARTMENT WITHIN ONE WEEK IF SPECIATION AND SENSITIVITIES ARE REQUIRED. Performed at [facility], [place] [address], [place], [place] [address]    Report Status 12/25/2020 FINAL  Final  Blood Culture ID Panel (Reflexed)     Status: Abnormal   Collection Time: 12/15/2020  5:30 PM  Result Value Ref Range Status   Enterococcus faecalis NOT DETECTED NOT DETECTED Final   Enterococcus Faecium NOT DETECTED NOT DETECTED Final   Listeria monocytogenes NOT DETECTED NOT DETECTED Final   Staphylococcus species DETECTED (Ediberto Sens) NOT DETECTED Final    Comment: CRITICAL RESULT CALLED TO, READ BACK BY AND VERIFIED WITH: PHARMD Shahir Karen.LAWLES AT 1041 ON 12/22/2020 BY T.SAAD.    Staphylococcus aureus (BCID) NOT DETECTED NOT DETECTED Final   Staphylococcus epidermidis DETECTED (Bowen Goyal) NOT DETECTED Final    Comment: CRITICAL RESULT CALLED TO, READ BACK BY AND VERIFIED WITH: PHARMD Stasia Somero.LAWLES AT 1041 ON 12/22/2020 BY T.SAAD.    Staphylococcus lugdunensis NOT DETECTED NOT DETECTED Final   Streptococcus species NOT DETECTED NOT DETECTED Final   Streptococcus agalactiae NOT DETECTED NOT DETECTED Final   Streptococcus pneumoniae NOT DETECTED NOT DETECTED Final   Streptococcus pyogenes NOT DETECTED NOT DETECTED Final   Elany Felix.calcoaceticus-baumannii NOT DETECTED NOT DETECTED Final   Bacteroides fragilis NOT DETECTED NOT DETECTED Final   Enterobacterales NOT DETECTED NOT DETECTED Final   Enterobacter  cloacae complex NOT DETECTED NOT DETECTED Final   Escherichia coli NOT DETECTED NOT DETECTED Final   Klebsiella aerogenes NOT DETECTED NOT DETECTED  Final   Klebsiella oxytoca NOT DETECTED NOT DETECTED Final   Klebsiella pneumoniae NOT DETECTED NOT DETECTED Final   Proteus species NOT DETECTED NOT DETECTED Final   Salmonella species NOT DETECTED NOT DETECTED Final   Serratia marcescens NOT DETECTED NOT DETECTED Final   Haemophilus influenzae NOT DETECTED NOT DETECTED Final   Neisseria meningitidis NOT DETECTED NOT DETECTED Final   Pseudomonas aeruginosa NOT DETECTED NOT DETECTED Final   Stenotrophomonas maltophilia NOT DETECTED NOT DETECTED Final   Candida albicans NOT DETECTED NOT DETECTED Final   Candida auris NOT DETECTED NOT DETECTED Final   Candida glabrata NOT DETECTED NOT DETECTED Final   Candida krusei NOT DETECTED NOT DETECTED Final   Candida parapsilosis NOT DETECTED NOT DETECTED Final   Candida tropicalis NOT DETECTED NOT DETECTED Final   Cryptococcus neoformans/gattii NOT DETECTED NOT DETECTED Final   Methicillin resistance mecA/C NOT DETECTED NOT DETECTED Final    Comment: Performed at [facility], [place] [address], [place], [place] [address]  Urine Culture     Status: None   Collection Time: 12/14/2020  9:46 PM   Specimen: Urine, Catheterized  Result Value Ref Range Status   Specimen Description URINE, CATHETERIZED  Final   Special Requests NONE  Final   Culture   Final    NO GROWTH Performed at [place] [address] [place], [place] [address]    Report Status 12/23/2020 FINAL  Final  MRSA Next Gen by PCR, Nasal     Status: None   Collection Time: 12/25/2020  9:59 PM  Result Value Ref Range Status   MRSA by PCR Next Gen NOT DETECTED NOT DETECTED Final    Comment: (NOTE) The GeneXpert MRSA Assay (FDA approved for NASAL specimens only), is one component of Minerva Bluett comprehensive MRSA colonization surveillance program. It is not intended to diagnose MRSA  infection nor to guide or monitor treatment for MRSA infections. Test performance is not FDA approved in patients less than 51 years old. Performed at [facility], [place] [address], [place], [place] [address]   Culture, blood (routine x 2)     Status: None (Preliminary result)   Collection Time: 12/23/20 10:30 PM   Specimen: BLOOD  Result Value Ref Range Status   Specimen Description BLOOD RIGHT HAND  Final   Special Requests   Final    BOTTLES DRAWN AEROBIC ONLY Blood Culture results may not be optimal due to an inadequate volume of blood received in culture bottles   Culture   Final    NO GROWTH 2 DAYS Performed at [facility], [place] [address], [place], [place] [address]    Report Status PENDING  Incomplete  Culture, blood (routine x 2)     Status: None (Preliminary result)   Collection Time: 12/23/20 10:33 PM   Specimen: BLOOD  Result Value Ref Range Status   Specimen Description BLOOD LOWER RIGHT HAND  Final   Special Requests   Final    BOTTLES DRAWN AEROBIC ONLY Blood Culture adequate volume   Culture   Final    NO GROWTH 2 DAYS Performed at [facility], [place] [address] [place], [place] [address]    Report Status PENDING  Incomplete         Radiology Studies: DG CHEST PORT 1 VIEW  Result Date: 12/25/2020 CLINICAL DATA:  Shortness of breath. EXAM: PORTABLE CHEST 1 VIEW COMPARISON:  December 24, 2020. FINDINGS: Stable cardiomediastinal silhouette. Increased bilateral airspace opacities are  noted, left greater than right, consistent with multifocal pneumonia. Small pleural effusions may be present. No pneumothorax is noted. Bony thorax is unremarkable. IMPRESSION: Increased bilateral airspace opacities are noted, left greater than right, consistent with multifocal pneumonia. Aortic Atherosclerosis (ICD10-I70.0). Electronically Signed   By: Marijo Conception M.D.   On: 12/25/2020 08:31        Scheduled Meds:  sodium chloride   Intravenous Once    brimonidine  1 drop Both Eyes q morning   latanoprost  1 drop Both Eyes QHS   levothyroxine  50 mcg Oral QAC breakfast   polyethylene glycol  17 g Oral BID   Continuous Infusions:  HYDROmorphone 0.5 mg/hr (12/25/20 1558)     LOS: 5 days    Time spent: over 30 min    Fayrene Helper, MD [facility]   To contact the attending provider between 7A-7P or the covering provider during after hours 7P-7A, please log into the web site [email or web address] and access using universal [place] password for that web site. If you do not have the password, please call the hospital operator.  01/11/2021, 12:51 PM

## 2021-01-14 DEATH — deceased

## 2021-10-30 IMAGING — CT CT HEAD W/O CM
4 series · 16 of 30 positions shown, 17 images · non-contrast
Comparison: Head CT 06/07/2020.

CLINICAL DATA: Mental status change, unknown cause mental status
change

EXAM:
CT HEAD WITHOUT CONTRAST
TECHNIQUE: Contiguous axial images were obtained from the base of the skull
through the vertex without intravenous contrast.

[Series 3: head without · axial · non-contrast · 0.44mm/px · z∈[+1422,+1472]mm · 2 of 31 slices shown, 3 images]
[im 11/31  brain]
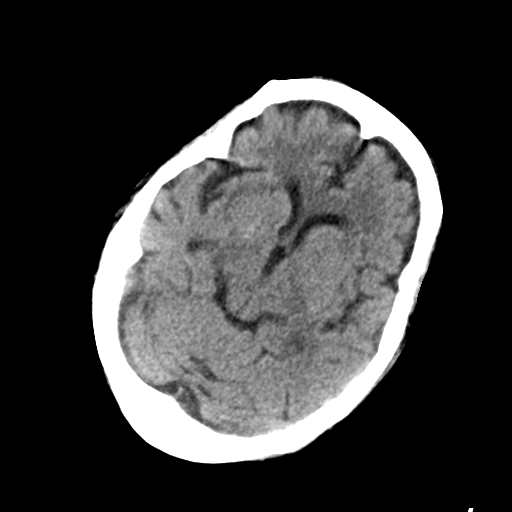
[im 11/31  bone]
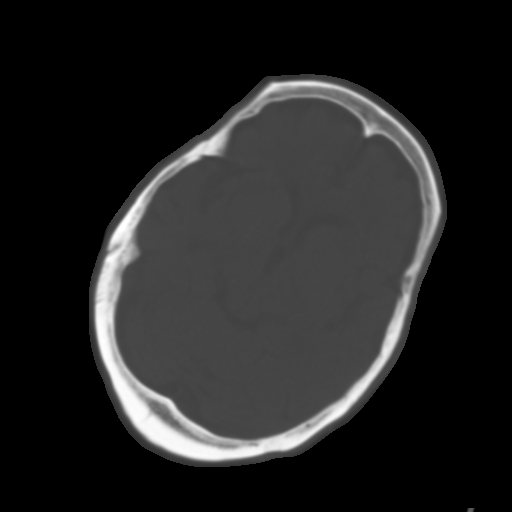
[im 21/31  brain]
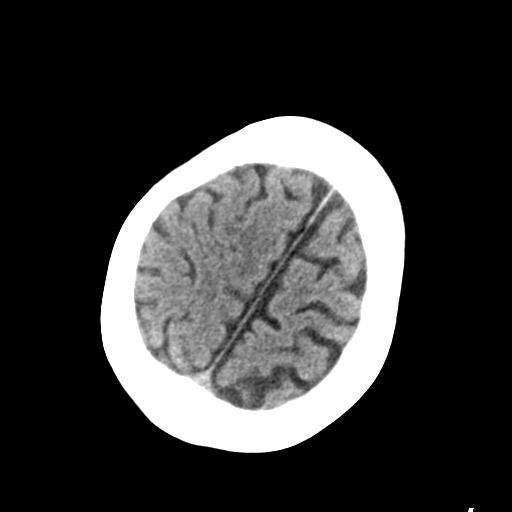

[Series 4: head bone · axial · 0.44mm/px · z∈[+1386,+1506]mm · 8 of 76 slices shown]
[im 8/76  bone]
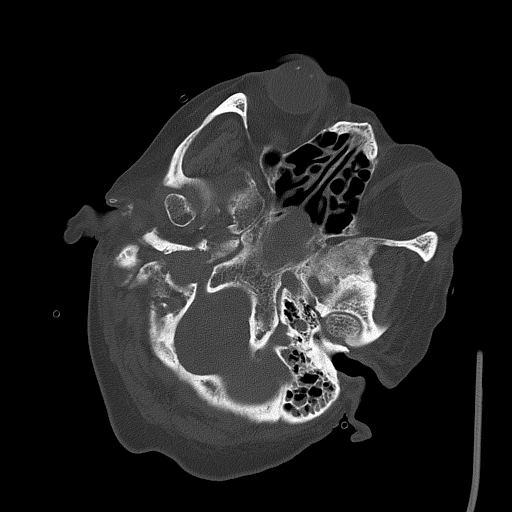
[im 16/76  bone]
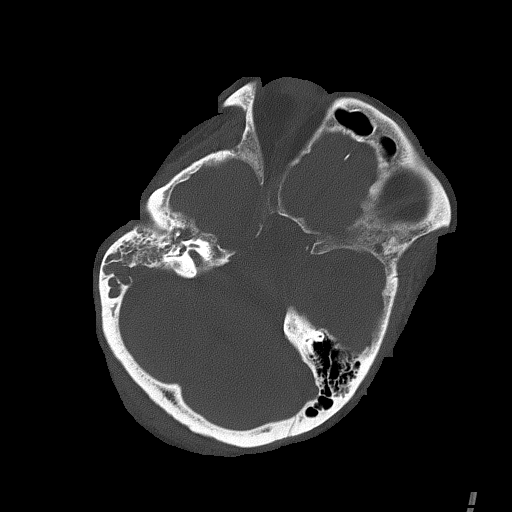
[im 23/76  bone]
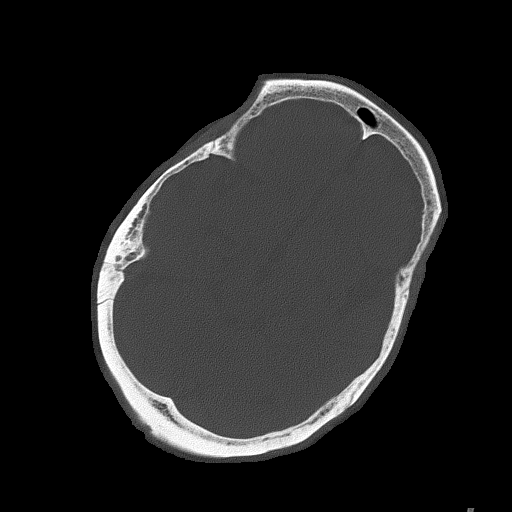
[im 31/76  bone]
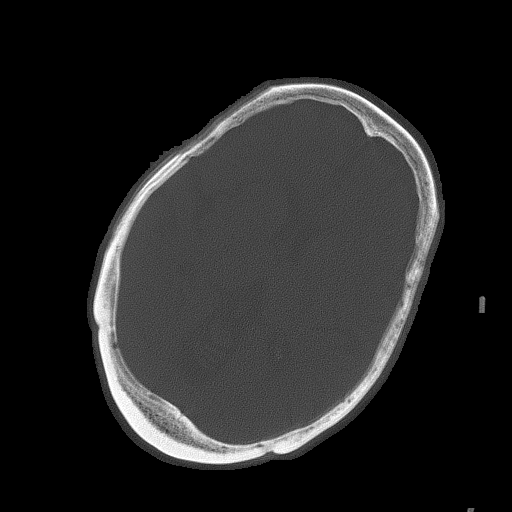
[im 46/76  bone]
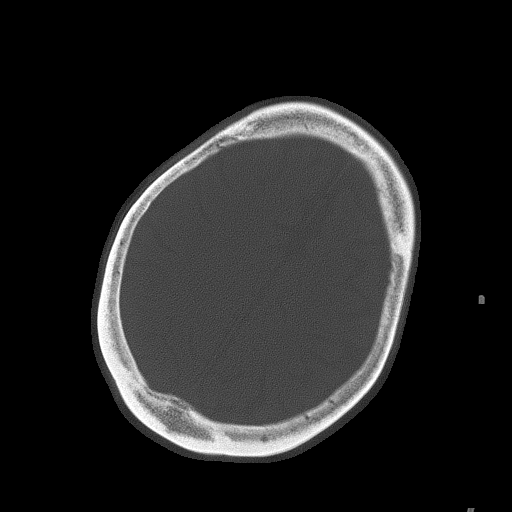
[im 53/76  bone]
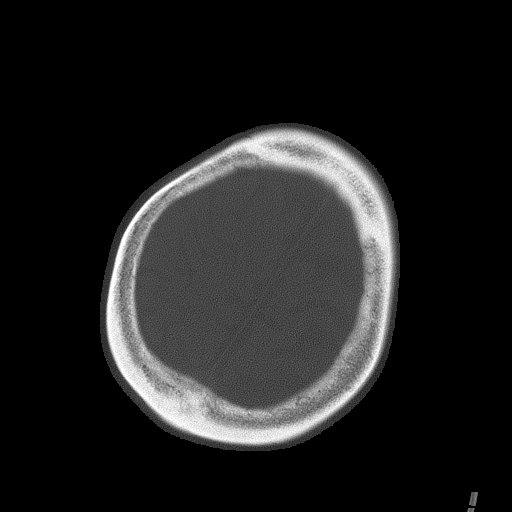
[im 61/76  bone]
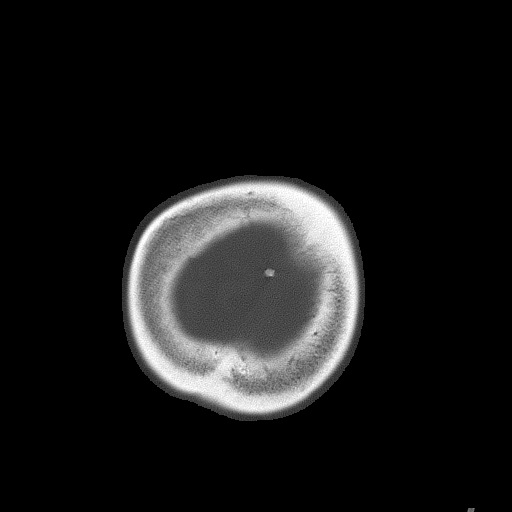
[im 68/76  bone]
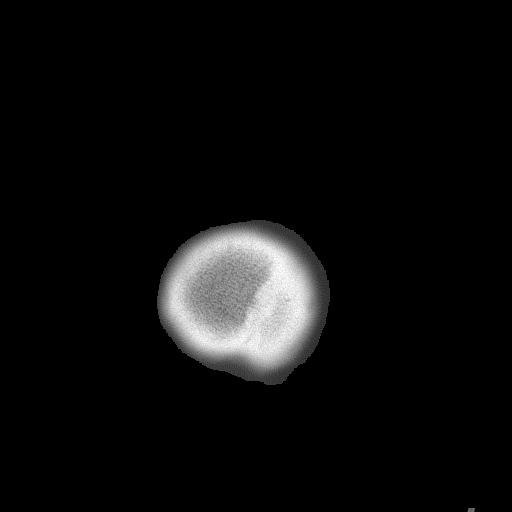

[Series 7: head without ax · axial · non-contrast · 0.33mm/px · z∈[+1444,+1493]mm · 2 of 30 slices shown]
[im 10/30  brain]
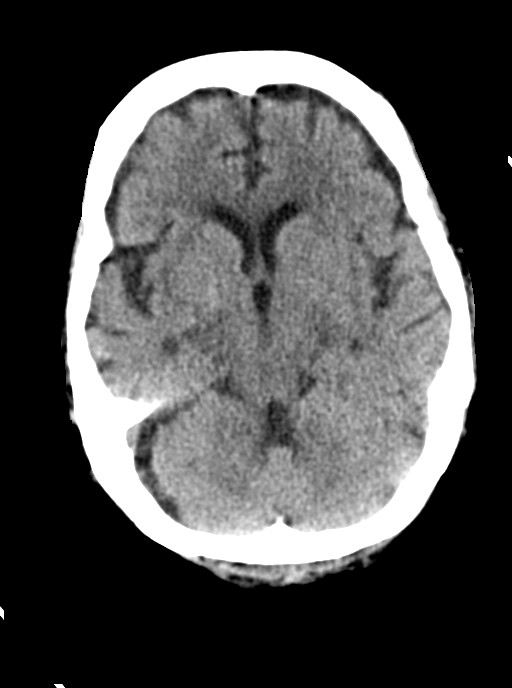
[im 20/30  brain]
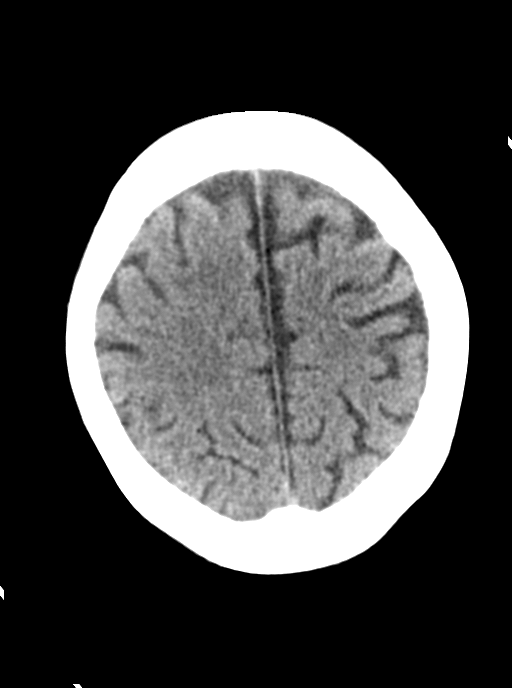

[Series 8: ax head bone · axial · 0.36mm/px · z∈[+1401,+1446]mm · 4 of 77 slices shown]
[im 8/77  bone]
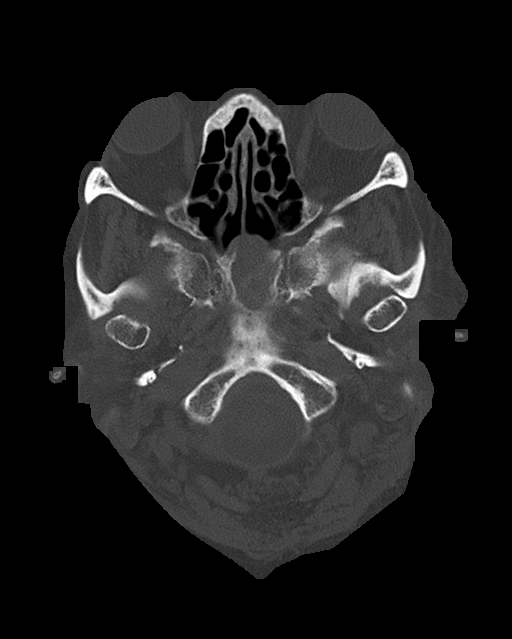
[im 16/77  bone]
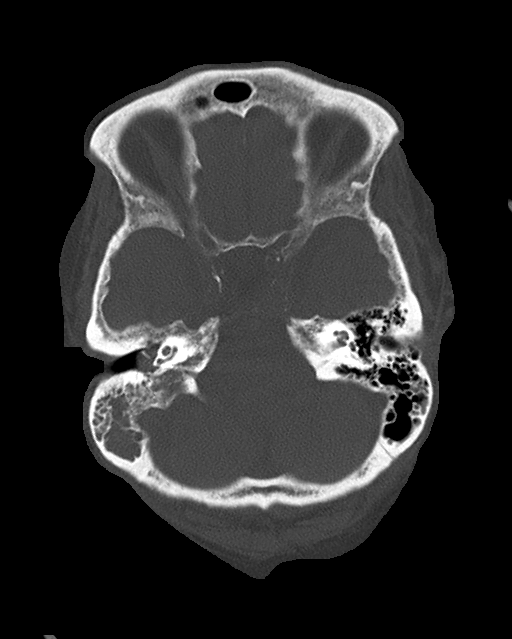
[im 23/77  bone]
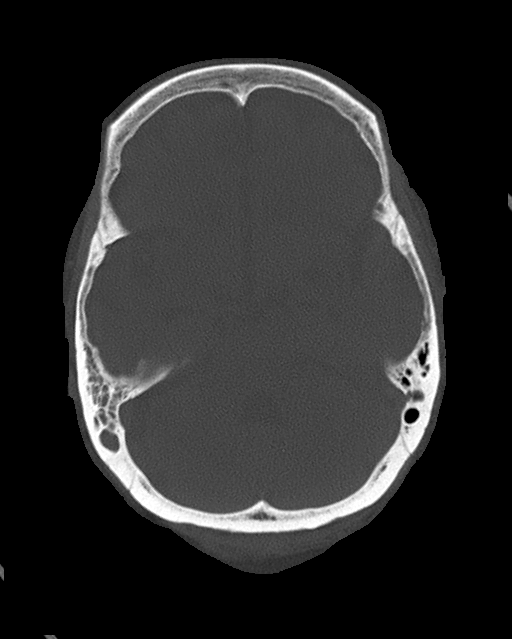
[im 31/77  bone]
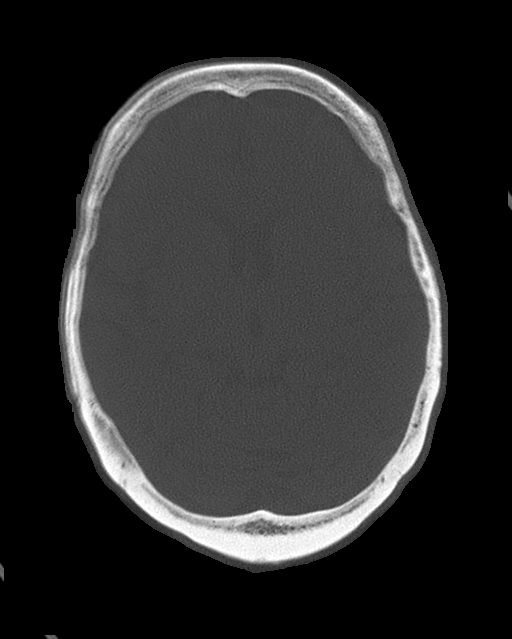

[16 of 30 positions shown; findings below may reference images not displayed]

FINDINGS: Brain: Heterogeneous density in the sella turcica of with sellar
expansion, not significantly changed from prior head CT. No acute
intracranial hemorrhage. Stable generalized atrophy and chronic
small vessel ischemia. No subdural or extra-axial collection. No
evidence of acute ischemia. No midline shift.

Vascular: No hyperdense vessel.

Skull: No skull fracture.

Sinuses/Orbits: Total opacification of right mastoid air cells,
unchanged from prior exam. Subtotal opacification of lower left
mastoid air cells, also unchanged. Bilateral cataract resection.

Other: None.
IMPRESSION: 1. No acute intracranial abnormality.
2. Stable atrophy and chronic small vessel ischemia.
3. Heterogeneous density in the sella turcica with sellar expansion,
not significantly changed from prior head CT. Patient with reported
history of prior pituitary surgery. This is incompletely assessed by
CT. As clinically indicated, recommend further assessment with
pituitary protocol MRI.
4. Bilateral mastoid effusions, unchanged from prior.
